# Patient Record
Sex: Male | Born: 1955 | Race: White | Hispanic: No | Marital: Married | Smoking: Current every day smoker
Health system: Southern US, Community
[De-identification: ages and names within clinical notes are randomized; demographics above are authoritative.]

## PROBLEM LIST (undated history)

## (undated) DIAGNOSIS — C801 Malignant (primary) neoplasm, unspecified: Secondary | ICD-10-CM

## (undated) DIAGNOSIS — D869 Sarcoidosis, unspecified: Secondary | ICD-10-CM

## (undated) HISTORY — PX: APPENDECTOMY: SHX54

---

## 2018-01-31 ENCOUNTER — Ambulatory Visit: Payer: Self-pay | Admitting: Family Medicine

## 2018-02-13 ENCOUNTER — Ambulatory Visit: Payer: Self-pay | Admitting: Nurse Practitioner

## 2018-10-07 ENCOUNTER — Emergency Department
Admission: EM | Admit: 2018-10-07 | Discharge: 2018-10-07 | Disposition: A | Discharge status: Home / Self Care | Payer: Commercial Managed Care - PPO | Attending: Student in an Organized Health Care Education/Training Program | Admitting: Student in an Organized Health Care Education/Training Program

## 2018-10-07 ENCOUNTER — Emergency Department: Payer: Commercial Managed Care - PPO

## 2018-10-07 ENCOUNTER — Other Ambulatory Visit: Payer: Self-pay

## 2018-10-07 DIAGNOSIS — R634 Abnormal weight loss: Secondary | ICD-10-CM | POA: Insufficient documentation

## 2018-10-07 DIAGNOSIS — F17210 Nicotine dependence, cigarettes, uncomplicated: Secondary | ICD-10-CM | POA: Diagnosis not present

## 2018-10-07 DIAGNOSIS — M7918 Myalgia, other site: Secondary | ICD-10-CM | POA: Diagnosis not present

## 2018-10-07 DIAGNOSIS — E871 Hypo-osmolality and hyponatremia: Secondary | ICD-10-CM | POA: Diagnosis not present

## 2018-10-07 DIAGNOSIS — F1020 Alcohol dependence, uncomplicated: Secondary | ICD-10-CM | POA: Insufficient documentation

## 2018-10-07 DIAGNOSIS — Z20828 Contact with and (suspected) exposure to other viral communicable diseases: Secondary | ICD-10-CM | POA: Diagnosis not present

## 2018-10-07 HISTORY — DX: Sarcoidosis, unspecified: D86.9

## 2018-10-07 LAB — TROPONIN I (HIGH SENSITIVITY): Troponin I (High Sensitivity): 3 ng/L (ref <18)

## 2018-10-07 LAB — COMPREHENSIVE METABOLIC PANEL
ALT: 29 U/L (ref 0–44)
AST: 42 U/L — ABNORMAL HIGH (ref 15–41)
Albumin: 4.2 g/dL (ref 3.5–5.0)
Alkaline Phosphatase: 114 U/L (ref 38–126)
Anion gap: 12 (ref 5–15)
BUN: 9 mg/dL (ref 8–23)
CO2: 25 mmol/L (ref 22–32)
Calcium: 9.4 mg/dL (ref 8.9–10.3)
Chloride: 93 mmol/L — ABNORMAL LOW (ref 98–111)
Creatinine, Ser: 0.59 mg/dL — ABNORMAL LOW (ref 0.61–1.24)
GFR calc Af Amer: >60 mL/min (ref >60)
GFR calc non Af Amer: >60 mL/min (ref >60)
Glucose, Bld: 106 mg/dL — ABNORMAL HIGH (ref 70–99)
Potassium: 4.9 mmol/L (ref 3.5–5.1)
Sodium: 130 mmol/L — ABNORMAL LOW (ref 135–145)
Total Bilirubin: 1.3 mg/dL — ABNORMAL HIGH (ref 0.3–1.2)
Total Protein: 8 g/dL (ref 6.5–8.1)

## 2018-10-07 LAB — URINALYSIS, COMPLETE (UACMP) WITH MICROSCOPIC
Bacteria, UA: NONE SEEN
Bilirubin Urine: NEGATIVE
Glucose, UA: NEGATIVE mg/dL
Hgb urine dipstick: NEGATIVE
Ketones, ur: 20 mg/dL — AB
Leukocytes,Ua: NEGATIVE
Nitrite: NEGATIVE
Protein, ur: NEGATIVE mg/dL
Specific Gravity, Urine: 1.009 (ref 1.005–1.030)
Squamous Epithelial / HPF: NONE SEEN (ref 0–5)
pH: 6 (ref 5.0–8.0)

## 2018-10-07 LAB — CBC
HCT: 48.1 % (ref 39.0–52.0)
Hemoglobin: 16.4 g/dL (ref 13.0–17.0)
MCH: 33.3 pg (ref 26.0–34.0)
MCHC: 34.1 g/dL (ref 30.0–36.0)
MCV: 97.8 fL (ref 80.0–100.0)
Platelets: 375 10*3/uL (ref 150–400)
RBC: 4.92 MIL/uL (ref 4.22–5.81)
RDW: 12.4 % (ref 11.5–15.5)
WBC: 10.2 10*3/uL (ref 4.0–10.5)
nRBC: 0 % (ref 0.0–0.2)

## 2018-10-07 LAB — LIPASE, BLOOD: Lipase: 36 U/L (ref 11–51)

## 2018-10-07 LAB — SARS CORONAVIRUS 2 (TAT 6-24 HRS): SARS Coronavirus 2: NEGATIVE

## 2018-10-07 MED ORDER — SODIUM CHLORIDE 0.9% FLUSH: 3.0000 mL | Freq: Once | INTRAVENOUS | Status: DC

## 2018-10-07 NOTE — ED Notes (Signed)
MD at bedside. 

## 2018-10-07 NOTE — ED Provider Notes (Signed)
[facility] Emergency Department Provider Note    First MD Initiated Contact with Patient 10/07/18 1200     (approximate)  I have reviewed the triage vital signs and the nursing notes.   HISTORY  Chief Complaint Weight Loss    HPI Darin Lowery is a 63 y.o. male presents the ER for evaluation of over 1 year bilateral shoulder ache tingling in his hands, daily loose brown stools.  Denies any active chest pain pressure right now.  Does smoke 1 pack/day.  Has not seen a primary care physician about any of these complaints.  Denies any nausea or vomiting.  No measured fevers.  Came to the ER this morning because he was having diffuse myalgias as well as cold sweats last night.  States he also drinks 6-7 beers nightly and has been doing this for the past several years.    Past Medical History:  Diagnosis Date  . Sarcoidosis    No family history on file. Past Surgical History:  Procedure Laterality Date  . APPENDECTOMY    . SPLENECTOMY     There are no active problems to display for this patient.     Prior to Admission medications   Not on File    Allergies Patient has no known allergies.    Social History Social History   Tobacco Use  . Smoking status: Current Every Day Smoker    Types: Cigarettes  . Smokeless tobacco: Never Used  Substance Use Topics  . Alcohol use: Yes    Alcohol/week: 6.0 standard drinks    Types: 6 Cans of beer per week  . Drug use: Not Currently    Review of Systems Patient denies headaches, rhinorrhea, blurry vision, numbness, shortness of breath, chest pain, edema, cough, abdominal pain, nausea, vomiting, diarrhea, dysuria, fevers, rashes or hallucinations unless otherwise stated above in HPI. ____________________________________________   PHYSICAL EXAM:  VITAL SIGNS: Vitals:   10/07/18 0945 10/07/18 1214  BP: (!) 136/92 128/85  Pulse: 88 78  Resp: 17 16  Temp: 98.1 F (36.7 C)   SpO2: 97% 99%     Constitutional: Alert and oriented.  Eyes: Conjunctivae are normal.  Head: Atraumatic. Nose: No congestion/rhinnorhea. Mouth/Throat: Mucous membranes are moist.   Neck: No stridor. Painless ROM.  Cardiovascular: Normal rate, regular rhythm. Grossly normal heart sounds.  Good peripheral circulation. Respiratory: Normal respiratory effort.  No retractions. Lungs CTAB. Gastrointestinal: Soft and nontender. No distention. No abdominal bruits. No CVA tenderness. Genitourinary:  Musculoskeletal: No lower extremity tenderness nor edema.  No joint effusions. Neurologic:  Normal speech and language. No gross focal neurologic deficits are appreciated. No facial droop Skin:  Skin is warm, dry and intact. No rash noted. Psychiatric: Mood and affect are normal. Speech and behavior are normal.  ____________________________________________   LABS (all labs ordered are listed, but only abnormal results are displayed)  Results for orders placed or performed during the hospital encounter of 10/07/18 (from the past 24 hour(s))  Troponin I (High Sensitivity)     Status: None   Collection Time: 10/07/18  8:55 AM  Result Value Ref Range   Troponin I (High Sensitivity) 3 <18 ng/L  Lipase, blood     Status: None   Collection Time: 10/07/18  9:55 AM  Result Value Ref Range   Lipase 36 11 - 51 U/L  Comprehensive metabolic panel     Status: Abnormal   Collection Time: 10/07/18  9:55 AM  Result Value Ref Range   Sodium 130 (  L) 135 - 145 mmol/L   Potassium 4.9 3.5 - 5.1 mmol/L   Chloride 93 (L) 98 - 111 mmol/L   CO2 25 22 - 32 mmol/L   Glucose, Bld 106 (H) 70 - 99 mg/dL   BUN 9 8 - 23 mg/dL   Creatinine, Ser 0.59 (L) 0.61 - 1.24 mg/dL   Calcium 9.4 8.9 - 10.3 mg/dL   Total Protein 8.0 6.5 - 8.1 g/dL   Albumin 4.2 3.5 - 5.0 g/dL   AST 42 (H) 15 - 41 U/L   ALT 29 0 - 44 U/L   Alkaline Phosphatase 114 38 - 126 U/L   Total Bilirubin 1.3 (H) 0.3 - 1.2 mg/dL   GFR calc non Af Amer >60 >60 mL/min   GFR  calc Af Amer >60 >60 mL/min   Anion gap 12 5 - 15  CBC     Status: None   Collection Time: 10/07/18  9:55 AM  Result Value Ref Range   WBC 10.2 4.0 - 10.5 K/uL   RBC 4.92 4.22 - 5.81 MIL/uL   Hemoglobin 16.4 13.0 - 17.0 g/dL   HCT 48.1 39.0 - 52.0 %   MCV 97.8 80.0 - 100.0 fL   MCH 33.3 26.0 - 34.0 pg   MCHC 34.1 30.0 - 36.0 g/dL   RDW 12.4 11.5 - 15.5 %   Platelets 375 150 - 400 K/uL   nRBC 0.0 0.0 - 0.2 %  Urinalysis, Complete w Microscopic     Status: Abnormal   Collection Time: 10/07/18  9:55 AM  Result Value Ref Range   Color, Urine YELLOW (A) YELLOW   APPearance CLEAR (A) CLEAR   Specific Gravity, Urine 1.009 1.005 - 1.030   pH 6.0 5.0 - 8.0   Glucose, UA NEGATIVE NEGATIVE mg/dL   Hgb urine dipstick NEGATIVE NEGATIVE   Bilirubin Urine NEGATIVE NEGATIVE   Ketones, ur 20 (A) NEGATIVE mg/dL   Protein, ur NEGATIVE NEGATIVE mg/dL   Nitrite NEGATIVE NEGATIVE   Leukocytes,Ua NEGATIVE NEGATIVE   RBC / HPF 0-5 0 - 5 RBC/hpf   WBC, UA 0-5 0 - 5 WBC/hpf   Bacteria, UA NONE SEEN NONE SEEN   Squamous Epithelial / LPF NONE SEEN 0 - 5   ____________________________________________  EKG My review and personal interpretation at Time: 12:26   Indication: weightloss  Rate: 75  Rhythm: sinus Axis: normal Other: normal intervals, no stemi ____________________________________________  RADIOLOGY  I personally reviewed all radiographic images ordered to evaluate for the above acute complaints and reviewed radiology reports and findings.  These findings were personally discussed with the patient.  Please see medical record for radiology report.  ____________________________________________   PROCEDURES  Procedure(s) performed:  Procedures    Critical Care performed: no ____________________________________________   INITIAL IMPRESSION / ASSESSMENT AND PLAN / ED COURSE  Pertinent labs & imaging results that were available during my care of the patient were reviewed by me and  considered in my medical decision making (see chart for details).   DDX: Electrolyte [place], dehydration, mass, malnutrition, alcohol dependence, alcoholism  Darin Lowery is a 63 y.o. who presents to the ED with symptoms as described above.  Does not have any acute symptoms at this time.  Is currently pain-free.  Seems to be more chronic issues play here.  I do suspect his alcohol dependence is having a deleterious effect on his health.  Does have borderline low sodium but not critically low hyponatremia.  Tolerating oral hydration.  Does not appear  clinically dehydrated.  No acidosis.  CBC is normal.    Clinical Course as of Oct 07 1419  Mon Oct 07, 2018  1420 Patient's blood work is reassuring.  We discussed his low sodium and need for close outpatient follow-up including increasing his daily sodium intake, decreasing beer and alcohol consumption and signs and symptoms for which she should seek more urgent medical attention.  His physical exam repeat exam is reassuring.  Do believe he stable and appropriate for outpatient follow-up.  He does not want to speak with substance abuse counselor at this time.  I provided outpatient resources.  He stable and appropriate for outpatient follow-up.   [PR]    Clinical Course User Index [PR] Merlyn Lot, MD    The patient was evaluated in Emergency Department today for the symptoms described in the history of present illness. He/she was evaluated in the context of the global COVID-19 pandemic, which necessitated consideration that the patient might be at risk for infection with the SARS-CoV-2 virus that causes COVID-19. Institutional protocols and algorithms that pertain to the evaluation of patients at risk for COVID-19 are in a state of rapid change based on information released by regulatory bodies including the CDC and federal and state organizations. These policies and algorithms were followed during the patient's care in the ED.  As part of my  medical decision making, I reviewed the following data within the [facility] notes reviewed and incorporated, Labs reviewed, notes from prior ED visits and [place] Controlled Substance Database   ____________________________________________   FINAL CLINICAL IMPRESSION(S) / ED DIAGNOSES  Final diagnoses:  Weight loss  Low sodium levels  Uncomplicated alcohol dependence ([place])      NEW MEDICATIONS STARTED DURING THIS VISIT:  New Prescriptions   No medications on file     Note:  This document was prepared using Dragon voice recognition software and may include unintentional dictation errors.    Merlyn Lot, MD 10/07/18 1421

## 2018-10-07 NOTE — ED Notes (Signed)
Pt gathered all personal belongings from room and removed them prior to ED departure  

## 2018-10-07 NOTE — ED Triage Notes (Signed)
Pt c/o BL shoulder pain, BL rib pain, 27lb wt loss with constant diarrhea. States he drink 6-7 beers every evening.

## 2018-12-07 ENCOUNTER — Other Ambulatory Visit: Payer: Self-pay

## 2018-12-07 DIAGNOSIS — Z20822 Contact with and (suspected) exposure to covid-19: Secondary | ICD-10-CM

## 2018-12-10 LAB — NOVEL CORONAVIRUS, NAA: SARS-CoV-2, NAA: NOT DETECTED

## 2018-12-29 ENCOUNTER — Encounter: Payer: Self-pay | Admitting: Emergency Medicine

## 2018-12-29 ENCOUNTER — Emergency Department
Admission: EM | Admit: 2018-12-29 | Discharge: 2018-12-29 | Disposition: A | Discharge status: Home / Self Care | Payer: Commercial Managed Care - PPO | Attending: Emergency Medicine | Admitting: Emergency Medicine

## 2018-12-29 ENCOUNTER — Emergency Department: Payer: Commercial Managed Care - PPO

## 2018-12-29 ENCOUNTER — Other Ambulatory Visit: Payer: Self-pay

## 2018-12-29 DIAGNOSIS — R160 Hepatomegaly, not elsewhere classified: Secondary | ICD-10-CM

## 2018-12-29 DIAGNOSIS — F17210 Nicotine dependence, cigarettes, uncomplicated: Secondary | ICD-10-CM | POA: Insufficient documentation

## 2018-12-29 DIAGNOSIS — R5383 Other fatigue: Secondary | ICD-10-CM | POA: Diagnosis present

## 2018-12-29 DIAGNOSIS — R634 Abnormal weight loss: Secondary | ICD-10-CM | POA: Diagnosis not present

## 2018-12-29 DIAGNOSIS — R0789 Other chest pain: Secondary | ICD-10-CM | POA: Diagnosis not present

## 2018-12-29 DIAGNOSIS — R531 Weakness: Secondary | ICD-10-CM | POA: Insufficient documentation

## 2018-12-29 LAB — BASIC METABOLIC PANEL
Anion gap: 10 (ref 5–15)
BUN: 11 mg/dL (ref 8–23)
CO2: 25 mmol/L (ref 22–32)
Calcium: 9.1 mg/dL (ref 8.9–10.3)
Chloride: 98 mmol/L (ref 98–111)
Creatinine, Ser: 0.68 mg/dL (ref 0.61–1.24)
GFR calc Af Amer: >60 mL/min (ref >60)
GFR calc non Af Amer: >60 mL/min (ref >60)
Glucose, Bld: 136 mg/dL — ABNORMAL HIGH (ref 70–99)
Potassium: 4 mmol/L (ref 3.5–5.1)
Sodium: 133 mmol/L — ABNORMAL LOW (ref 135–145)

## 2018-12-29 LAB — CBC
HCT: 44.4 % (ref 39.0–52.0)
Hemoglobin: 14.6 g/dL (ref 13.0–17.0)
MCH: 31 pg (ref 26.0–34.0)
MCHC: 32.9 g/dL (ref 30.0–36.0)
MCV: 94.3 fL (ref 80.0–100.0)
Platelets: 498 10*3/uL — ABNORMAL HIGH (ref 150–400)
RBC: 4.71 MIL/uL (ref 4.22–5.81)
RDW: 12.9 % (ref 11.5–15.5)
WBC: 11.9 10*3/uL — ABNORMAL HIGH (ref 4.0–10.5)
nRBC: 0 % (ref 0.0–0.2)

## 2018-12-29 LAB — TSH: TSH: 2.677 u[IU]/mL (ref 0.350–4.500)

## 2018-12-29 LAB — URINALYSIS, COMPLETE (UACMP) WITH MICROSCOPIC
Bacteria, UA: NONE SEEN
Bilirubin Urine: NEGATIVE
Glucose, UA: NEGATIVE mg/dL
Hgb urine dipstick: NEGATIVE
Ketones, ur: NEGATIVE mg/dL
Leukocytes,Ua: NEGATIVE
Nitrite: NEGATIVE
Protein, ur: NEGATIVE mg/dL
Specific Gravity, Urine: 1.009 (ref 1.005–1.030)
Squamous Epithelial / HPF: NONE SEEN (ref 0–5)
pH: 6 (ref 5.0–8.0)

## 2018-12-29 LAB — HEPATIC FUNCTION PANEL
ALT: 44 U/L (ref 0–44)
AST: 73 U/L — ABNORMAL HIGH (ref 15–41)
Albumin: 3.2 g/dL — ABNORMAL LOW (ref 3.5–5.0)
Alkaline Phosphatase: 374 U/L — ABNORMAL HIGH (ref 38–126)
Bilirubin, Direct: 0.3 mg/dL — ABNORMAL HIGH (ref 0.0–0.2)
Indirect Bilirubin: 0.7 mg/dL (ref 0.3–0.9)
Total Bilirubin: 1 mg/dL (ref 0.3–1.2)
Total Protein: 7.2 g/dL (ref 6.5–8.1)

## 2018-12-29 LAB — LIPASE, BLOOD: Lipase: 29 U/L (ref 11–51)

## 2018-12-29 MED ORDER — IOHEXOL 300 MG/ML  SOLN
100.0000 mL | Freq: Once | INTRAMUSCULAR | Status: AC | PRN
  Administered 2018-12-29: 100 mL via INTRAVENOUS

## 2018-12-29 NOTE — ED Notes (Signed)
Pt has returned from CT.  

## 2018-12-29 NOTE — ED Notes (Signed)
Patient transported to CT 

## 2018-12-29 NOTE — ED Notes (Signed)
Patient states that he has had about 30 pound weight loss in the last 6 months. Pt states that he eats, but still loses weight. Pt denies N/V/D, or abdominal pain. Pt reports occasional "soreness" in his chest.

## 2018-12-29 NOTE — ED Triage Notes (Signed)
Patient presents to the ED with weakness, fatigue and weight loss for several months.  Patient states going up the stairs at his house feels impossible at times.  Patient reports losing 30lb without trying to over the past couple of months.  Patient reports lower left chest pain when he does minor activities.

## 2018-12-29 NOTE — Discharge Instructions (Addendum)
Please call Dr. Kem Parkinson office tomorrow morning to start helping arrange follow up. Please seek medical attention for any high fevers, chest pain, shortness of breath, change in behavior, persistent vomiting, bloody stool or any other new or concerning symptoms.

## 2018-12-29 NOTE — ED Provider Notes (Signed)
[facility] Emergency Department Provider Note  ____________________________________________   I have reviewed the triage vital signs and the nursing notes.   HISTORY  Chief Complaint Fatigue and Weight Loss   History limited by: Not Limited   HPI Darin Lowery is a 63 y.o. male who presents to the emergency department today because of concern for fatigue for weeks. He is also concerned that he has continued weight loss. The patient states that normally he can get up early in the morning and go to work and still have energy afterwards but now feels that he has a hard time getting out of the bed and then is exhausted when he returns home. He has cut down on his drinking, only drinking 3 beers a day. Denies any fevers. Does state he has been having pain in his left lower chest/left upper abdomen. Did try to make a follow up appointment with a primary care physician but appointment is still not for a couple of months. Denies any nausea or vomiting. Denies any current diarrhea.    Records reviewed. Per medical record review patient has a history of ER visit roughly 2 months ago also complaining of weight loss.   Past Medical History:  Diagnosis Date  . Sarcoidosis     There are no problems to display for this patient.   Past Surgical History:  Procedure Laterality Date  . APPENDECTOMY    . SPLENECTOMY      Prior to Admission medications   Not on File    Allergies Patient has no known allergies.  No family history on file.  Social History Social History   Tobacco Use  . Smoking status: Current Every Day Smoker    Types: Cigarettes  . Smokeless tobacco: Never Used  Substance Use Topics  . Alcohol use: Yes    Alcohol/week: 6.0 standard drinks    Types: 6 Cans of beer per week  . Drug use: Not Currently    Review of Systems Constitutional: No fever/chills Eyes: No visual changes. ENT: No sore throat. Cardiovascular: Positive for left  lower chest pain. Respiratory: Denies shortness of breath. Gastrointestinal: Positive for left upper abdominal pain. Genitourinary: Negative for dysuria. Musculoskeletal: Negative for back pain. Skin: Negative for rash. Neurological: Negative for headaches, focal weakness or numbness.  ____________________________________________   PHYSICAL EXAM:  VITAL SIGNS: ED Triage Vitals [12/29/18 1228]  Enc Vitals Group     BP 138/85     Pulse Rate 89     Resp 16     Temp 98.6 F (37 C)     Temp Source Oral     SpO2 100 %     Weight 137 lb (62.1 kg)     Height 5' 10" (1.778 m)     Head Circumference      Peak Flow      Pain Score 2   Constitutional: Alert and oriented.  Eyes: Conjunctivae are normal.  ENT      Head: Normocephalic and atraumatic.      Nose: No congestion/rhinnorhea.      Mouth/Throat: Mucous membranes are moist.      Neck: No stridor. Hematological/Lymphatic/Immunilogical: No cervical lymphadenopathy. Cardiovascular: Normal rate, regular rhythm.  No murmurs, rubs, or gallops.  Respiratory: Normal respiratory effort without tachypnea nor retractions. Breath sounds are clear and equal bilaterally. No wheezes/rales/rhonchi. Gastrointestinal: Soft and non tender. No rebound. No guarding.  Genitourinary: Deferred Musculoskeletal: Normal range of motion in all extremities. No lower extremity edema. Neurologic:  Normal  speech and language. No gross focal neurologic deficits are appreciated.  Skin:  Skin is warm, dry and intact. No rash noted. Psychiatric: Mood and affect are normal. Speech and behavior are normal. Patient exhibits appropriate insight and judgment.  ____________________________________________    LABS (pertinent positives/negatives)  UA clear, unremarkable BMP na 133, glu 136 otherwise wnl CBC wbc 11.9, hgb 14.9, plt 498 Hepatic function panel alb 3.2, ast 73, alk phos 374, t bili 1.0 TSH 2.677 Lipase  29 ____________________________________________   EKG  I, Graydon Goodman, attending physician, personally viewed and interpreted this EKG  EKG Time: 1234 Rate: 89 Rhythm: normal sinus rhythm Axis: right axis deviation Intervals: qtc 435 QRS: narrow ST changes: no st elevation Impression: abnormal ekg  ____________________________________________    RADIOLOGY  CXR Questionable nodules seen in left lower lung  CT chest/abd/pelvis 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 Concern for hepatocellular carcinoma.  ____________________________________________   PROCEDURES  Procedures  ____________________________________________   INITIAL IMPRESSION / ASSESSMENT AND PLAN / ED COURSE  Pertinent labs & imaging results that were available during my care of the patient were reviewed by me and considered in my medical decision making (see chart for details).   Patient presented to the emergency department today because of concerns for increasing fatigue and continued weight loss.  On exam patient without any focal neuro findings.  Chest x-ray was concerning for possible nodules.  CT scan of his chest and abdomen pelvis was performed.  This did show a large mass in the liver with satellite lesions and concern for metastatic disease in the lungs.  Is concerning for hepatocellular carcinoma.  Patient would certainly be at risk for this given heavy alcohol use.  I did discuss with the patient these findings.  Discussed with patient importance of close oncology follow-up.  ___________________________________________   FINAL CLINICAL IMPRESSION(S) / ED DIAGNOSES  Final diagnoses:  Weakness  Weight loss  Liver mass     Note: This dictation was prepared with Dragon dictation. Any transcriptional errors that result from this process are unintentional     Goodman, Graydon, MD 12/29/18 1909  

## 2018-12-30 NOTE — Progress Notes (Signed)
[facility]  [address] [place], [place] [address] Phone: [phone]  Fax: [phone]   Clinic Day:  12/31/2018  Referring physician: Nance Pear, MD  Chief Complaint: Darin Lowery is a 63 y.o. male with sarcoidosis and possible hepatocellular carcinoma who is referred in consultation by Dr. Nance Lowery for assessment and management.   HPI:  The patient was seen in the [facility] ER on 12/29/2018 for fatigue and unintentional weight loss. He described fatigue for weeks.  He denied any fevers. He noted left upper abdominal pain (2/10) and left lower chest pain (2/10). He denied any nausea, emesis, or diarrhea. He previously drank 7-8 beers/day, but has cut back to 3 beers/day.   Labs included a hematocrit of 44.4, hemoglobin 14.6, MCV 94.3, platelets 498,000, and WBC 11,900.  TSH was 2.677 (0.35 - 4.50).  Creatinine was 0.68.  Albumen was 3.2.  LFTs included an AST 73, ALT 44, alkaline phosphatase 374, and bilirubin 0.7 (direct 0.3).  Lipase was 29.  Urinalysis was negative.  CXR on 12/29/2018 showed bronchitic changes with questionable LEFT lung nodular foci and potential RIGHT nipple shadow.   Chest, abdomen, and pelvis CT on 12/29/2018 revealed a 12 x 12 x 10 cm heterogeneously enhancing mass involving the RIGHT lobe of the liver, likely hepatocellular carcinoma.  There were satellite masses in the POSTERIOR segment RIGHT lobe (1.5 x 1.3 x 1.8 cm) and in the LATERAL segment LEFT lobe of the liver (1.9 x 1.7 x 1.5 cm). There was metastatic lymphadenopathy in the porta hepatis and in the gastrohepatic ligament. There was numerous bilateral pulmonary metastases (largest 12 x 8 mm in the RLL) and metastatic mediastinal lymphadenopathy. There was a 1.4 x 2.3 cm partially necrotic subcarinal node and a 1.7 x 2.3 cm necrotic paracardiac node INFERIOR to the heart.  There was likely hepatic cirrhosis. There was no ascites.  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 There was moderate prostate gland enlargement, particularly the median lobe. There was a 3.1 cm proximal descending thoracic aortic aneurysm.   Symptomatically, he feels fatigued.  He has rib pain. He has whole body pain after any physical activity. He had a fever (102 degrees) about 1.5 months ago.  COVID-19 test was negative. Fever was reduced with Tylenol.  He denies any bloody stool or melena. His wife notes that he has frequency (x3-4) at night.   He notes unintentional weight loss over the past year. His normal weight is 165 pounds; his lowest weight was 130 pounds. He weighs 142 pounds today. He is eating well with no change in his diet. He has had off and on diarrhea for 1 year. He denies any constipation, flushing, nausea, or vomiting.    His last GI evaluation was years ago. His last colonoscopy was normal in 2014 at [facility].  He notes  scarring of the stomach lining after being on prednisone and a cold medication that contained aspirin.  He had a blood transfusion in the 1980s.   He has a history of sarcoidosis diagnosed on 1980. He notes his spleen was enlarged and full of granulomas.  He later underwent a splenectomy. He denies any adenopathy in his chest.  He had a kidney biopsy at [facility]. He notes 2 or 3 liver biopsies with report of granuloma.  He was seen at [facility] in [place], [place].  He currently has no PCP.    Past Medical History:  Diagnosis Date  . Sarcoidosis  Past Surgical History:  Procedure Laterality Date  . APPENDECTOMY    . SPLENECTOMY      Family History  Problem Relation Age of Onset  . Liver disease Father   His father had hemophilia.  He died from cirrhosis secondary to hepatitis B received from a blood transfusion. There are no other known blood disorders or cancers that run in his family.    Social History:  reports that he has been smoking cigarettes. He has never used smokeless  tobacco. He reports current alcohol use of about 6.0 standard drinks of alcohol per week. He reports previous drug use. 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 He has been smoking for 40 years. Patient smokes 1.5 packs a day. Patient used to drink 7-8 beers a day for > 3 years. The patient is accompanied by his wife, Darin Lowery, via Face time today.  Allergies: No Known Allergies  Current Medications: No current outpatient medications on file.   No current facility-administered medications for this visit.    Review of Systems  Constitutional: Positive for malaise/fatigue and weight loss (23 pounds; unintentional ). Negative for chills, diaphoresis and fever (1 month ago).  HENT: Negative.  Negative for congestion, ear pain, hearing loss, nosebleeds, sinus pain and sore throat.   Eyes: Negative.  Negative for blurred vision, double vision and photophobia.  Respiratory: Positive for cough and shortness of breath. Negative for hemoptysis and sputum production.   Cardiovascular: Negative.  Negative for chest pain, palpitations and leg swelling.  Gastrointestinal: Positive for diarrhea (on and off; episodes last x 1 week). Negative for abdominal pain, blood in stool, constipation, melena, nausea and vomiting.       Colonoscopy in 2014.  Eating well.  No change in diet.  Genitourinary: Positive for frequency (3-4 x nightly). Negative for dysuria, hematuria and urgency.  Musculoskeletal: Negative for back pain, joint pain (bilateral shoulders secondary to physical activity), myalgias (bilateral arms secondary to physical activity) and neck pain.       Whole body pain after physical activity. Rib pain exacerbated by coughing.  Skin: Negative.  Negative for itching and rash.  Neurological: Positive for sensory change (numbness). Negative for dizziness, tingling, speech change, focal weakness, weakness and headaches.  Endo/Heme/Allergies: Does not bruise/bleed easily.        Sarcoidosis.  Psychiatric/Behavioral: Negative.  Negative for depression and memory loss. The patient is not nervous/anxious and does not have insomnia.   All other systems reviewed and are negative.  Performance status (ECOG): 1  Vitals Blood pressure 130/82, pulse 83, temperature (!) 96.7 F (35.9 C), temperature source Tympanic, weight 142 lb 3.2 oz (64.5 kg), SpO2 97 %.   Physical Exam  Constitutional: He is oriented to person, place, and time. He appears well-developed and well-nourished. No distress.  HENT:  Head: Normocephalic and atraumatic.  Mouth/Throat: Oropharynx is clear and moist. No oropharyngeal exudate.  Black cap.  Short graying hair.   Mask.  Eyes: Pupils are equal, round, and reactive to light. Conjunctivae and EOM are normal. No scleral icterus.  Glasses.  Hazel/brown eyes.  Cardiovascular: Normal rate and regular rhythm.  No murmur heard. Pulmonary/Chest: Effort normal and breath sounds normal. No respiratory distress. He has no wheezes. He has no rales. He exhibits no tenderness.  Abdominal: Soft. Bowel sounds are normal. He exhibits no distension and no mass. There is hepatomegaly (palpable 3 FB below right costal margin). There is no abdominal tenderness. There is no rebound  and no guarding.  Well healed incisions s/p liver biopsy and splenectomy.  Musculoskeletal:        General: No tenderness or edema. Normal range of motion.     Cervical back: Normal range of motion and neck supple.  Lymphadenopathy:       Head (right side): No preauricular, no posterior auricular and no occipital adenopathy present.       Head (left side): No preauricular, no posterior auricular and no occipital adenopathy present.    He has no cervical adenopathy.    He has no axillary adenopathy.       Right: No inguinal and no supraclavicular adenopathy present.       Left: No inguinal and no supraclavicular adenopathy present.  Neurological: He is alert and oriented to person, place,  and time.  Skin: Skin is warm and dry. No rash noted. He is not diaphoretic. No erythema. No pallor.  Psychiatric: He has a normal mood and affect. His behavior is normal. Judgment and thought content normal.  Nursing note and vitals reviewed.   Admission on 12/29/2018, Discharged on 12/29/2018  Component Date Value Ref Range Status  . Sodium 12/29/2018 133* 135 - 145 mmol/L Final  . Potassium 12/29/2018 4.0  3.5 - 5.1 mmol/L Final  . Chloride 12/29/2018 98  98 - 111 mmol/L Final  . CO2 12/29/2018 25  22 - 32 mmol/L Final  . Glucose, Bld 12/29/2018 136* 70 - 99 mg/dL Final  . BUN 12/29/2018 11  8 - 23 mg/dL Final  . Creatinine, Ser 12/29/2018 0.68  0.61 - 1.24 mg/dL Final  . Calcium 12/29/2018 9.1  8.9 - 10.3 mg/dL Final  . GFR calc non Af Amer 12/29/2018 >60  >60 mL/min Final  . GFR calc Af Amer 12/29/2018 >60  >60 mL/min Final  . Anion gap 12/29/2018 10  5 - 15 Final   Performed at [facility], [address], [place], [place] [address]  . WBC 12/29/2018 11.9* 4.0 - 10.5 K/uL Final  . RBC 12/29/2018 4.71  4.22 - 5.81 MIL/uL Final  . Hemoglobin 12/29/2018 14.6  13.0 - 17.0 g/dL Final  . HCT 12/29/2018 44.4  39.0 - 52.0 % Final  . MCV 12/29/2018 94.3  80.0 - 100.0 fL Final  . MCH 12/29/2018 31.0  26.0 - 34.0 pg Final  . MCHC 12/29/2018 32.9  30.0 - 36.0 g/dL Final  . RDW 12/29/2018 12.9  11.5 - 15.5 % Final  . Platelets 12/29/2018 498* 150 - 400 K/uL Final  . nRBC 12/29/2018 0.0  0.0 - 0.2 % Final   Performed at [facility], [address], [place],  [address]  . Color, Urine 12/29/2018 YELLOW* YELLOW Final  . APPearance 12/29/2018 CLEAR* CLEAR Final  . Specific Gravity, Urine 12/29/2018 1.009  1.005 - 1.030 Final  . pH 12/29/2018 6.0  5.0 - 8.0 Final  . Glucose, UA 12/29/2018 NEGATIVE  NEGATIVE mg/dL Final  . Hgb urine dipstick 12/29/2018 NEGATIVE  NEGATIVE Final  . Bilirubin Urine 12/29/2018 NEGATIVE  NEGATIVE Final  . Ketones, ur 12/29/2018  NEGATIVE  NEGATIVE mg/dL Final  . Protein, ur 12/29/2018 NEGATIVE  NEGATIVE mg/dL Final  . Nitrite 12/29/2018 NEGATIVE  NEGATIVE Final  . Chalmers Guest 12/29/2018 NEGATIVE  NEGATIVE Final  . RBC / HPF 12/29/2018 0-5  0 - 5 RBC/hpf Final  . WBC, UA 12/29/2018 0-5  0 - 5 WBC/hpf Final  . Bacteria, UA 12/29/2018 NONE SEEN  NONE SEEN Final  . Squamous Epithelial /  LPF 12/29/2018 NONE SEEN  0 - 5 Final   Performed at [facility], [address] [place], [place] [address]  . Total Protein 12/29/2018 7.2  6.5 - 8.1 g/dL Final  . Albumin 12/29/2018 3.2* 3.5 - 5.0 g/dL Final  . AST 12/29/2018 73* 15 - 41 U/L Final  . ALT 12/29/2018 44  0 - 44 U/L Final  . Alkaline Phosphatase 12/29/2018 374* 38 - 126 U/L Final  . Total Bilirubin 12/29/2018 1.0  0.3 - 1.2 mg/dL Final  . Bilirubin, Direct 12/29/2018 0.3* 0.0 - 0.2 mg/dL Final  . Indirect Bilirubin 12/29/2018 0.7  0.3 - 0.9 mg/dL Final   Performed at [facility], [address], [place], [place] [address]  . Lipase 12/29/2018 29  11 - 51 U/L Final   Performed at [facility], [address] [place], [place] [address]  . TSH 12/29/2018 2.677  0.350 - 4.500 uIU/mL Final   Comment: Performed by a 3rd Generation assay with a functional sensitivity of <=0.01 uIU/mL. Performed at [facility], [address], [place], [place] [address]     Assessment:  Darin Lowery is a 63 y.o. male with probable metastatic hepatocellular carcinoma.  Chest, abdomen, and pelvis CT on 12/29/2018 revealed a 12 x 12 x 10 cm heterogeneously enhancing mass involving the RIGHT lobe of the liver, likely indicating hepatocellular carcinoma.  There were satellite masses in the POSTERIOR segment RIGHT lobe (1.5 x 1.3 x 1.8 cm) and in the LATERAL segment LEFT lobe of the liver (1.9 x 1.7 x 1.5 cm). There was metastatic lymphadenopathy in the porta hepatis and in the gastrohepatic ligament. There was numerous bilateral pulmonary metastases  (largest 12 x 8 mm in the RLL) and metastatic mediastinal lymphadenopathy. There was a 1.4 x 2.3 cm partially necrotic subcarinal node and a 1.7 x 2.3 cm necrotic paracardiac node INFERIOR to the heart.  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  There was no ascites.  There was moderate prostate gland enlargement, particularly the median lobe. There was a 3.1 cm proximal descending thoracic aortic aneurysm.   Child-Pugh score is 6 (class A).   He has a history of sarcoid s/p multiple biopsies (including liver) which revealed granulomas (no report available).  He has a history of blood transfusion in the 1980s.  He has a 60 pack year smoking history.  Symptomatically, he is fatigued.  He has lost 23 pounds in the past year.  He has had on and off diarrhea.  Exam reveals hepatomegaly.  Plan: 1.   Labs today:  AFP, CEA, chromogranin, PSA, PT/INR, hepatitis B core antibody total, hepatitis B surface antigen, hepatitis C antibody. 2.   Probable metastatic hepatocellular carcinoma  Patient has underlying cirrhosis with varices and no ascites.  He has no history of hepatitis, but notes a history of transfusion.  He has a history of alcohol use.  Discuss plan for liver biopsy after review of imaging with radiology.  Patient has unresectable disease.  Preliminary discussions regarding treatment options if [place] documented.   Lenvatinib or atezolizumab + Avastin.  3.   Bone pain  Patient notes pain in ribs and upper arms.  Bone scan to r/o metastasis. 4.   Enlarged prostate  Doubt significance.  Check PSA. 5.   Anticipate liver biopsy next week. 6.   Present at tumor board on 01/09/2019. 7.   RTC 3 days after liver biopsy for MD assessment, review of labs and biopsy, and discussion regarding direction  of therapy.  Addendum:  AFP was 38,357.  Discussed with radiology who recommended liver MRI prior to consideration of biopsy.  I discussed the assessment and treatment plan with the  patient.  The patient was provided an opportunity to ask questions and all were answered.  The patient agreed with the plan and demonstrated an understanding of the instructions.  The patient was advised to call back if the symptoms worsen or if the condition fails to improve as anticipated.   Merlin Golden C. Mike Gip, MD, PhD    12/31/2018, 11:42 AM  I, Selena Batten, am acting as scribe for [employer]. Mike Gip, MD, PhD.  I, Rai Severns C. Mike Gip, MD, have reviewed the above documentation for accuracy and completeness, and I agree with the above.

## 2018-12-31 ENCOUNTER — Telehealth: Payer: Self-pay

## 2018-12-31 ENCOUNTER — Other Ambulatory Visit: Payer: Self-pay

## 2018-12-31 ENCOUNTER — Inpatient Hospital Stay: Payer: Commercial Managed Care - PPO

## 2018-12-31 ENCOUNTER — Inpatient Hospital Stay: Payer: Commercial Managed Care - PPO | Attending: Hematology and Oncology | Admitting: Hematology and Oncology

## 2018-12-31 ENCOUNTER — Encounter: Payer: Self-pay | Admitting: Hematology and Oncology

## 2018-12-31 VITALS — BP 130/82 | HR 83 | Temp 96.7°F | Wt 142.2 lb

## 2018-12-31 DIAGNOSIS — N400 Enlarged prostate without lower urinary tract symptoms: Secondary | ICD-10-CM

## 2018-12-31 DIAGNOSIS — R5383 Other fatigue: Secondary | ICD-10-CM | POA: Insufficient documentation

## 2018-12-31 DIAGNOSIS — C7801 Secondary malignant neoplasm of right lung: Secondary | ICD-10-CM | POA: Diagnosis present

## 2018-12-31 DIAGNOSIS — M898X2 Other specified disorders of bone, upper arm: Secondary | ICD-10-CM

## 2018-12-31 DIAGNOSIS — C7802 Secondary malignant neoplasm of left lung: Secondary | ICD-10-CM | POA: Diagnosis present

## 2018-12-31 DIAGNOSIS — Z87891 Personal history of nicotine dependence: Secondary | ICD-10-CM | POA: Insufficient documentation

## 2018-12-31 DIAGNOSIS — Z9081 Acquired absence of spleen: Secondary | ICD-10-CM | POA: Insufficient documentation

## 2018-12-31 DIAGNOSIS — R748 Abnormal levels of other serum enzymes: Secondary | ICD-10-CM

## 2018-12-31 DIAGNOSIS — R197 Diarrhea, unspecified: Secondary | ICD-10-CM

## 2018-12-31 DIAGNOSIS — C801 Malignant (primary) neoplasm, unspecified: Secondary | ICD-10-CM | POA: Diagnosis present

## 2018-12-31 DIAGNOSIS — C772 Secondary and unspecified malignant neoplasm of intra-abdominal lymph nodes: Secondary | ICD-10-CM | POA: Diagnosis not present

## 2018-12-31 DIAGNOSIS — Z7289 Other problems related to lifestyle: Secondary | ICD-10-CM | POA: Diagnosis not present

## 2018-12-31 DIAGNOSIS — F17210 Nicotine dependence, cigarettes, uncomplicated: Secondary | ICD-10-CM | POA: Diagnosis not present

## 2018-12-31 DIAGNOSIS — R918 Other nonspecific abnormal finding of lung field: Secondary | ICD-10-CM

## 2018-12-31 DIAGNOSIS — R634 Abnormal weight loss: Secondary | ICD-10-CM | POA: Diagnosis not present

## 2018-12-31 DIAGNOSIS — K7460 Unspecified cirrhosis of liver: Secondary | ICD-10-CM

## 2018-12-31 DIAGNOSIS — R160 Hepatomegaly, not elsewhere classified: Secondary | ICD-10-CM

## 2018-12-31 DIAGNOSIS — Z7189 Other specified counseling: Secondary | ICD-10-CM | POA: Insufficient documentation

## 2018-12-31 DIAGNOSIS — D869 Sarcoidosis, unspecified: Secondary | ICD-10-CM | POA: Diagnosis not present

## 2018-12-31 DIAGNOSIS — R0781 Pleurodynia: Secondary | ICD-10-CM

## 2018-12-31 LAB — PROTIME-INR
INR: 1 (ref 0.8–1.2)
Prothrombin Time: 12.9 seconds (ref 11.4–15.2)

## 2018-12-31 LAB — PSA: Prostatic Specific Antigen: 1.25 ng/mL (ref 0.00–4.00)

## 2018-12-31 LAB — HEPATITIS C ANTIBODY: HCV Ab: NONREACTIVE

## 2018-12-31 LAB — APTT: aPTT: 31 seconds (ref 24–36)

## 2018-12-31 LAB — HEPATITIS B SURFACE ANTIGEN: Hepatitis B Surface Ag: NONREACTIVE

## 2018-12-31 LAB — HEPATITIS B CORE ANTIBODY, TOTAL: Hep B Core Total Ab: NONREACTIVE

## 2018-12-31 NOTE — Telephone Encounter (Signed)
Faxed bx orders to scheduling dept. For a liver bx CT guided.

## 2019-01-01 LAB — AFP TUMOR MARKER: AFP, Serum, Tumor Marker: 38357 ng/mL — ABNORMAL HIGH (ref 0.0–8.3)

## 2019-01-01 LAB — CEA: CEA: 5.8 ng/mL — ABNORMAL HIGH (ref 0.0–4.7)

## 2019-01-02 ENCOUNTER — Telehealth: Payer: Self-pay | Admitting: Hematology and Oncology

## 2019-01-02 LAB — CHROMOGRANIN A: Chromogranin A (ng/mL): 59.4 ng/mL (ref 0.0–101.8)

## 2019-01-02 NOTE — Telephone Encounter (Signed)
Re:  Liver biopsy vs liver MRI  Discussed with patient's wife conversation with radiology regarding best location for biopsy- liver.  Radiology felt liver MRI may provide the diagnosis.  If liver MRI is not definitive, biopsy would be required.  Patient's wife in agreement.  Order placed.  Patient to be presented at tumor board on 01/09/2019.  Review labs revealing markedly elevated AFP.   Lequita Asal, MD

## 2019-01-08 ENCOUNTER — Ambulatory Visit
Admission: RE | Admit: 2019-01-08 | Discharge: 2019-01-08 | Disposition: A | Discharge status: Home / Self Care | Payer: Commercial Managed Care - PPO | Source: Ambulatory Visit | Attending: Hematology and Oncology | Admitting: Hematology and Oncology

## 2019-01-08 ENCOUNTER — Encounter (HOSPITAL_BASED_OUTPATIENT_CLINIC_OR_DEPARTMENT_OTHER)
Admission: RE | Admit: 2019-01-08 | Discharge: 2019-01-08 | Disposition: A | Discharge status: Home / Self Care | Payer: Commercial Managed Care - PPO | Source: Ambulatory Visit | Attending: Hematology and Oncology | Admitting: Hematology and Oncology

## 2019-01-08 ENCOUNTER — Other Ambulatory Visit: Payer: Self-pay

## 2019-01-08 DIAGNOSIS — M898X2 Other specified disorders of bone, upper arm: Secondary | ICD-10-CM

## 2019-01-08 DIAGNOSIS — R0781 Pleurodynia: Secondary | ICD-10-CM | POA: Insufficient documentation

## 2019-01-08 DIAGNOSIS — R160 Hepatomegaly, not elsewhere classified: Secondary | ICD-10-CM

## 2019-01-08 MED ORDER — TECHNETIUM TC 99M MEDRONATE IV KIT
20.0000 | PACK | Freq: Once | INTRAVENOUS | Status: AC | PRN
  Administered 2019-01-08: 22.2 via INTRAVENOUS

## 2019-01-09 ENCOUNTER — Other Ambulatory Visit: Payer: Commercial Managed Care - PPO

## 2019-01-09 NOTE — Progress Notes (Signed)
Tumor Board Documentation  Darin Lowery was presented by Dr Mike Gip at our Tumor Board on 01/09/2019, which included representatives from medical oncology, radiation oncology, navigation, pathology, radiology, surgical, surgical oncology, internal medicine, pharmacy, genetics, palliative care, research.  Darin Lowery currently presents as a new patient, for discussion with history of the following treatments: active survellience.  Additionally, we reviewed previous medical and familial history, history of present illness, and recent lab results along with all available histopathologic and imaging studies. The tumor board considered available treatment options and made the following recommendations: Additional screening, Biopsy, Immunotherapy    The following procedures/referrals were also placed: No orders of the defined types were placed in this encounter.   Clinical Trial Status: not discussed   Staging used:    National site-specific guidelines   were discussed with respect to the case.  Tumor board is a meeting of clinicians from various specialty areas who evaluate and discuss patients for whom a multidisciplinary approach is being considered. Final determinations in the plan of care are those of the provider(s). The responsibility for follow up of recommendations given during tumor board is that of the provider.   Today's extended care, comprehensive team conference, Darin Lowery was not present for the discussion and was not examined.   Multidisciplinary Tumor Board is a multidisciplinary case peer review process.  Decisions discussed in the Multidisciplinary Tumor Board reflect the opinions of the specialists present at the conference without having examined the patient.  Ultimately, treatment and diagnostic decisions rest with the primary provider(s) and the patient.

## 2019-01-11 ENCOUNTER — Ambulatory Visit
Admission: RE | Admit: 2019-01-11 | Discharge: 2019-01-11 | Disposition: A | Discharge status: Home / Self Care | Payer: Commercial Managed Care - PPO | Source: Ambulatory Visit | Attending: Hematology and Oncology | Admitting: Hematology and Oncology

## 2019-01-11 ENCOUNTER — Other Ambulatory Visit: Payer: Self-pay

## 2019-01-11 DIAGNOSIS — R160 Hepatomegaly, not elsewhere classified: Secondary | ICD-10-CM | POA: Diagnosis present

## 2019-01-11 DIAGNOSIS — K7460 Unspecified cirrhosis of liver: Secondary | ICD-10-CM | POA: Diagnosis present

## 2019-01-11 MED ORDER — GADOBUTROL 1 MMOL/ML IV SOLN
6.0000 mL | Freq: Once | INTRAVENOUS | Status: AC | PRN
  Administered 2019-01-11: 14:00:00 6 mL via INTRAVENOUS

## 2019-01-14 NOTE — Progress Notes (Signed)
[facility]  [address] [place], [place] [address] Phone: [phone]  Fax: [phone]   Clinic Day:  01/15/2019  Referring physician: Sallee Lange, *  Chief Complaint: Darin Lowery is a 64 y.o. male with sarcoidosis and hepatocellular carcinoma who is seen for review of interval imaging and discussion regarding direction of treatment after initial consultation.   HPI: The patient was last seen in the medical oncology clinic on 12/31/2018 for initial consultation.  Chest, abdomen, and pelvics CT revealed a 12 cm mass in the right lobe of the liver with satellite masses in the posterior segment of the right lobe and lateral segment of the left lobe. There was metastatic lymphadenopathy in the porta hepatis, mediastinal adenopathy, and bilateral pulmonary nodules.  Symptomatically,  he was fatigued.  He had lost 23 pounds in the past year.  He had on and off diarrhea. Exam revealed hepatomegaly.   Work up revealed:  AFP 38,357, CEA 5.7, PSA of 1.25, chromogranin A 59.4.  Hepatitis B core and surface antigen and hepatitis C were negative.  PT/INR was 12.9/1.0 and PTT was 31.  Bone scan on 01/08/2019 showed focal activity in the medial left iliac bone. Depending on clinical concern this could be further evaluated with MRI of the pelvis. therwise there was no evidence of malignancy.  Tumor board recommendation on 01/09/2019 were additional screenings, a biopsy and immunotherapy.   Liver MRI on 01/11/2019 showed cirrhosis. There was an avidly enhancing dominant poorly marginated 12.3 cm superior liver mass, compatible with locally advanced hepatocellular carcinoma. There were numerous (> 10) additional avidly enhancing liver metastases scattered throughout the liver as detailed. There was porta hepatis and gastrohepatic ligament lymphadenopathy, suspicious for metastatic disease.  There were numerous subcentimeter pulmonary nodules scattered at both  lung bases, suspicious for pulmonary metastases. There was trace perihepatic ascites.  There was moderate to large esophageal varices. There were scattered regions of mild intrahepatic biliary ductal dilatation throughout the liver due to mass-effect from liver masses. There was a normal caliber common bile duct (2 mm diameter).  During the interim, he has had some nausea and issues sleeping but otherwise unchanged.  Symptomatically, he feels the same. He would like to be treated without a biopsy. He is willing to see a gastroenterologist regarding his esophageal varices. He is leaning more towards IV treatment rather than pills (lenvatinib).  He is not interested in a port-a-cath, but will have the nurse assess his veins.   Past Medical History:  Diagnosis Date  . Sarcoidosis     Past Surgical History:  Procedure Laterality Date  . APPENDECTOMY    . SPLENECTOMY      Family History  Problem Relation Age of Onset  . Liver disease Father     Social History:  reports that he has been smoking cigarettes. He has never used smokeless tobacco. He reports current alcohol use of about 6.0 standard drinks of alcohol per week. He reports previous drug use. 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 He has been smoking for 40 years. Patient smokes 1.5 packs a day. Patient used to drink 7-8 beers a day for > 3 years. He owns a [employer]. His wife's name is [name]. The patient is accompanied by wife via video today.  Allergies: No Known Allergies  Current Medications: Current Outpatient Medications  Medication Sig Dispense Refill  . ondansetron (ZOFRAN) 8 MG tablet Take 1 tablet (8 mg total) by  mouth every 8 (eight) hours as needed for nausea or vomiting. 20 tablet 0   No current facility-administered medications for this visit.    Review of Systems  Constitutional: Positive for malaise/fatigue and weight loss (24 pounds; 1 pound since last visit). Negative for  chills, diaphoresis and fever.  HENT: Negative.  Negative for congestion, ear pain, hearing loss, nosebleeds, sinus pain and sore throat.   Eyes: Negative.  Negative for blurred vision, double vision and photophobia.  Respiratory: Positive for cough and shortness of breath. Negative for hemoptysis and sputum production.   Cardiovascular: Negative.  Negative for chest pain, palpitations and leg swelling.  Gastrointestinal: Positive for diarrhea (on and off) and nausea. Negative for abdominal pain, blood in stool, constipation, melena and vomiting.       Colonoscopy in 2014.  Eating well.  Genitourinary: Positive for frequency (at night). Negative for dysuria, hematuria and urgency.  Musculoskeletal: Positive for joint pain (bilateral shoulders secondary to physical activity) and myalgias (bilateral arms secondary to physical activity). Negative for back pain and neck pain.       Whole body pain after physical activity. Rib pain exacerbated by coughing.  Skin: Negative.  Negative for itching and rash.  Neurological: Positive for sensory change (numbness). Negative for dizziness, tingling, speech change, focal weakness, weakness and headaches.  Endo/Heme/Allergies: Does not bruise/bleed easily.       Sarcoidosis.  Psychiatric/Behavioral: Negative.  Negative for depression and memory loss. The patient is not nervous/anxious and does not have insomnia.   All other systems reviewed and are negative.  Performance status (ECOG):  1  Vitals Blood pressure 123/83, pulse 87, temperature (!) 97.5 F (36.4 C), temperature source Tympanic, resp. rate 18, height 5\' 10"  (1.778 m), weight 141 lb 8.6 oz (64.2 kg), SpO2 100 %.   Physical Exam  Constitutional: He is oriented to person, place, and time. He appears well-developed and well-nourished. No distress.  HENT:  Head: Normocephalic and atraumatic.  Short graying hair.   Mask.  Eyes: Conjunctivae and EOM are normal. No scleral icterus.  Glasses.   Hazel/brown eyes.  Neurological: He is alert and oriented to person, place, and time.  Skin: He is not diaphoretic.  Psychiatric: He has a normal mood and affect. His behavior is normal. Judgment and thought content normal.  Nursing note and vitals reviewed.   No visits with results within 3 Day(s) from this visit.  Latest known visit with results is:  Appointment on 12/31/2018  Component Date Value Ref Range Status  . Prostatic Specific Antigen 12/31/2018 1.25  0.00 - 4.00 ng/mL Final   Comment: (NOTE) While PSA levels of <=4.0 ng/ml are reported as reference range, some men with levels below 4.0 ng/ml can have prostate cancer and many men with PSA above 4.0 ng/ml do not have prostate cancer.  Other tests such as free PSA, age specific reference ranges, PSA velocity and PSA doubling time may be helpful especially in men less than 69 years old. Performed at [facility], [place] [address] [place], [place] [address]   . Chromogranin A (ng/mL) 12/31/2018 59.4  0.0 - 101.8 ng/mL Final   Comment: (NOTE) Results of this test are labeled for research purposes only by the assay's manufacturer. The performance characteristics of this assay have not been established by the manufacturer. The result should not be used for treatment or for diagnostic purposes without confirmation of the diagnosis by another medically established diagnostic product or procedure. The performance characteristics were determined by  [facility]. Chromogranin A performed by Thermofisher/BRAHMS KRYPTOR methodology Values obtained with different assay methods or kits cannot be used interchangeably. Performed At: [facility] [place], [place] [id number] Rush Farmer MD [id number]   . aPTT 12/31/2018 31  24 - 36 seconds Final   Performed at [facility], [place] [place], [place] [address]  . HCV Ab 12/31/2018 NON REACTIVE  NON REACTIVE Final   Comment:  (NOTE) Nonreactive HCV antibody screen is consistent with no HCV infections,  unless recent infection is suspected or other evidence exists to indicate HCV infection. Performed at [facility], [place] [address] [place], [place] [address]   . Hepatitis B Surface Ag 12/31/2018 NON REACTIVE  NON REACTIVE Final   Performed at [facility], [place] [address] [place], [place] [address]  . CEA 12/31/2018 5.8* 0.0 - 4.7 ng/mL Final   Comment: (NOTE)                             Nonsmokers          <3.9                             Smokers             <5.6 [employer] Electrochemiluminescence Immunoassay (ECLIA) Values obtained with different assay methods or kits cannot be used interchangeably.  Results cannot be interpreted as absolute evidence of the presence or absence of malignant disease. Performed At: [facility] [place], [place] [id number] Rush Farmer MD [id number]   . AFP, Serum, Tumor Marker 12/31/2018 38,357.0* 0.0 - 8.3 ng/mL Final   Comment: (NOTE) Results confirmed on dilution. [employer] Electrochemiluminescence Immunoassay (ECLIA) Values obtained with different assay methods or kits cannot be used interchangeably.  Results cannot be interpreted as absolute evidence of the presence or absence of malignant disease. This test is not interpretable in pregnant females. Performed At: [facility] [place], [place] [id number] Rush Farmer MD [id number]   . Prothrombin Time 12/31/2018 12.9  11.4 - 15.2 seconds Final  . INR 12/31/2018 1.0  0.8 - 1.2 Final   Comment: (NOTE) INR goal varies based on device and disease states. Performed at [facility], [address], [place], [place] [address]   . Hep B Core Total Ab 12/31/2018 NON REACTIVE  NON REACTIVE Final   Performed at [facility], [place] [address] [place], [place] [address]    Assessment:  Darin Lowery is a 64 y.o. male  with probable metastatic hepatocellular carcinoma.  Chest, abdomen, and pelvis CT on 12/29/2018 revealed a 12 x 12 x 10 cm heterogeneously enhancing mass involving the RIGHT lobe of the liver, likely indicating hepatocellular carcinoma.  There were satellite masses in the POSTERIOR segment RIGHT lobe (1.5 x 1.3 x 1.8 cm) and in the LATERAL segment LEFT lobe of the liver (1.9 x 1.7 x 1.5 cm). There was metastatic lymphadenopathy in the porta hepatis and in the gastrohepatic ligament. There was numerous bilateral pulmonary metastases (largest 12 x 8 mm in the RLL) and metastatic mediastinal lymphadenopathy. There was a 1.4 x 2.3 cm partially necrotic subcarinal node and a 1.7 x 2.3 cm necrotic paracardiac node INFERIOR to the heart.  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  There was no ascites.  There was moderate prostate gland enlargement, particularly the median  lobe. There was a 3.1 cm proximal descending thoracic aortic aneurysm.   Work up on 12/31/2018 revealed an AFP 38,357, CEA 5.7, PSA of 1.25, chromogranin A 59.4.  Hepatitis B core and surface antigen and hepatitis C were negative.  PT/INR was 12.9/1.0 and PTT was 31.  Bone scan on 01/08/2019 showed focal activity in the medial left iliac bone. Depending on clinical concern this could be further evaluated with MRI of the pelvis.Otherwise there was no evidence of malignancy.  [facility] MRI on 01/11/2019 showed cirrhosis.  There was an avidly enhancing dominant poorly marginated 12.3 cm superior liver mass, compatible with locally advanced hepatocellular carcinoma. There were numerous (> 10) additional avidly enhancing liver metastases scattered throughout the liver as detailed. There was porta hepatis and gastrohepatic ligament lymphadenopathy, suspicious for metastatic disease.  There were numerous subcentimeter pulmonary nodules scattered at both lung bases, suspicious for pulmonary metastases. There was trace perihepatic ascites.  There  was moderate to large esophageal varices. There were scattered regions of mild intrahepatic biliary ductal dilatation throughout the liver due to mass-effect from liver masses. There was a normal caliber common bile duct (2 mm diameter).  Child-Pugh score is 6 (class A).   He has a history of sarcoid s/p multiple biopsies (including liver) which revealed granulomas (no report available).  He has a history of blood transfusion in the 1980s.  He has a 60 pack year smoking history.  Symptomatically, he denies any new complaints.  He remains fatigued.  He has lost an additional pound.  Plan: 1.   Metastatic hepatocellular carcinoma             Patient has underlying cirrhosis with varices and no ascites.             He has a history of alcohol use.             Review interval liver MRI.   Imaging reviewed with Dr Pascal Lux of radiology.  Agree with report.   MRI c/w advanced hepatocellular carcinoma.   AFP 38,357 (0-8.3).  Discuss patient's thoughts about confirmatory biopsy.   Patient declines.  Disease is unresectable.  Treatment is palliative.             Discuss treatment option:                         Lenvatinib or atezolizumab + Avastin.  Review clinic studies:                         Lenvatinib or sorafenib.                                     Phase II trial:  46 patients, nenvatinib, PR 37% and SD 41%.  TTP 7.8 months  OS 18.7 months.    REFLECT study: 954 patients    OS 13.6 months vs 12.3 months;  RR 24% vs 9%.  TTP 7.4 months vs 3.7 months.                                     Side effects:  Grade 3/4 HTN 23% vs 14%, HFS 37% vs 52%, alopecia 25% vs 3%.    Other: increased QTc, GI toxicity (diarrhea, perforation/fistula, elevated LFTs), bleeding, proteinuria, electrolyte issues.  Atezolizumab + Avastin (Child-Pugh Class A)                                     As compared to sorafenib: 12 month survival 67% vs 55%, PFS 6.8 vs 4.3 months, RR 27% vs 12%.                                      Side effects:  HTN, increased LFTs, proteinuria, bleeding/thrombosis, bowel perforation  Discuss management of esophageal varices.              Referral to GI.  Discuss chemotherapy class. 3.   Bone pain             Patient describes pain in ribs and upper arms with activity.  Bone scan revealed focal activity in the medial left iliac bone.    Significance unclear.   Consider future MRI of pelvis.  If bone metastasis documented, will pursue Xgeva. 4.   Enlarged prostate             PSA was 1.25.  Urinary frequency likely secondary to BPH. 5.   Nausea  Rx:  ondansetron 8 mg po q 8 hours prn nausea (dis: #20). 6.   RN to assess veins for treatment.  Veins adequate for treatment. 7.   Preauth atezolizumab + Avastin. 8.   Schedule chemotherapy class. 9.   Consult GI (Dr Allen Norris). 10.   RTC in 1 week for MD assessment, labs (CBC with diff, CMP, Mg, AFP), and cycle #1  atezolizumab + Avastin.  I discussed the assessment and treatment plan with the patient.  The patient was provided an opportunity to ask questions and all were answered.  The patient agreed with the plan and demonstrated an understanding of the instructions.  The patient was advised to call back if the symptoms worsen or if the condition fails to improve as anticipated.  I provided 40 minutes (8:50 AM - 9:30 AM) of face-to-face time during this encounter and > 50% was spent counseling as documented under my assessment and plan.   Additional time was spent reviewing imaging with radiology.   Lequita Asal, MD, PhD    01/15/2019, 9:30 AM  I, Samul Dada, am acting as a scribe for Lequita Asal, MD.  I, [employer] Mike Gip, MD, have reviewed the above documentation for accuracy and completeness, and I agree with the above.

## 2019-01-15 ENCOUNTER — Other Ambulatory Visit: Payer: Self-pay

## 2019-01-15 ENCOUNTER — Encounter: Payer: Self-pay | Admitting: Hematology and Oncology

## 2019-01-15 ENCOUNTER — Inpatient Hospital Stay: Payer: Commercial Managed Care - PPO | Attending: Hematology and Oncology | Admitting: Hematology and Oncology

## 2019-01-15 VITALS — BP 123/83 | HR 87 | Temp 97.5°F | Resp 18 | Ht 70.0 in | Wt 141.5 lb

## 2019-01-15 DIAGNOSIS — Z7189 Other specified counseling: Secondary | ICD-10-CM

## 2019-01-15 DIAGNOSIS — Z7289 Other problems related to lifestyle: Secondary | ICD-10-CM | POA: Diagnosis not present

## 2019-01-15 DIAGNOSIS — M898X9 Other specified disorders of bone, unspecified site: Secondary | ICD-10-CM | POA: Diagnosis not present

## 2019-01-15 DIAGNOSIS — N400 Enlarged prostate without lower urinary tract symptoms: Secondary | ICD-10-CM | POA: Insufficient documentation

## 2019-01-15 DIAGNOSIS — K5900 Constipation, unspecified: Secondary | ICD-10-CM | POA: Diagnosis not present

## 2019-01-15 DIAGNOSIS — K7460 Unspecified cirrhosis of liver: Secondary | ICD-10-CM | POA: Insufficient documentation

## 2019-01-15 DIAGNOSIS — F17210 Nicotine dependence, cigarettes, uncomplicated: Secondary | ICD-10-CM | POA: Insufficient documentation

## 2019-01-15 DIAGNOSIS — R918 Other nonspecific abnormal finding of lung field: Secondary | ICD-10-CM

## 2019-01-15 DIAGNOSIS — R7989 Other specified abnormal findings of blood chemistry: Secondary | ICD-10-CM | POA: Insufficient documentation

## 2019-01-15 DIAGNOSIS — I8390 Asymptomatic varicose veins of unspecified lower extremity: Secondary | ICD-10-CM | POA: Diagnosis not present

## 2019-01-15 DIAGNOSIS — R110 Nausea: Secondary | ICD-10-CM | POA: Diagnosis not present

## 2019-01-15 DIAGNOSIS — G893 Neoplasm related pain (acute) (chronic): Secondary | ICD-10-CM | POA: Diagnosis not present

## 2019-01-15 DIAGNOSIS — C220 Liver cell carcinoma: Secondary | ICD-10-CM | POA: Insufficient documentation

## 2019-01-15 MED ORDER — ONDANSETRON HCL 8 MG PO TABS: 8.0000 mg | ORAL_TABLET | Freq: Three times a day (TID) | ORAL | 0 refills | Status: AC | PRN

## 2019-01-15 NOTE — Patient Instructions (Signed)
Liver Cancer  Liver cancer is an abnormal growth of cells (tumor) in the liver. The liver is located on the upper right side of the abdomen, just below the ribs. What are the causes? The exact cause of this condition is not known. What increases the risk? You are more likely to develop this condition if you:  Are a male.  Have scarring of the liver (cirrhosis). Cirrhosis may be caused by: ? Too much alcohol use. ? Hepatitis B or hepatitis C. ? Smoking.  Have diabetes.  Have a condition in which the body stores too much iron (hemochromatosis).  Have a buildup of fat in the liver without alcohol use (nonalcoholic fatty liver disease).  Are exposed to aflatoxins. These are substances made by certain types of mold that grow on food products, such as corn and peanuts.  Use drugs that build muscles (anabolic steroids) or medicines that prevent pregnancy (oral contraceptives).  Have a disease caused by parasitic flatworms (schistosomiasis).  Are obese. What are the signs or symptoms? In some cases, there are few or no symptoms in the beginning. As the disease progresses, symptoms may include:  Weight loss.  Loss of appetite.  Nausea or vomiting.  Feeling itchy.  Abnormal bruising or bleeding.  Feeling very weak and tired.  Pain on the right side of your abdomen, shoulder, or back.  Skin or eyes that look yellow in color (jaundice).  Dark-colored urine.  White, chalk-like stools. Liver cancer may cause other medical conditions to develop, such as:  Hypercalcemia. This is high calcium levels in the blood, which may cause weakness, constipation, or confusion.  Hypoglycemia. This is low blood sugar, which may cause fatigue, confusion, and sweating.  Enlarged breasts in men (gynecomastia).  Small testicles in men.  Looking and feeling flushed due to a high amount of red blood cells in the body.  High levels of fat in the blood (cholesterol). How is this  diagnosed? This condition is diagnosed with a medical history and physical exam. You may also have tests, including:  Blood tests.  Imaging tests, such as: ? CT scan. ? MRI. ? Ultrasound. ? Bone scan.  Laparoscopy. A small, lighted camera (laparoscope) is used to look at your liver and other organs.  Biopsy. Samples of tissue from the liver are taken and tested in a lab. If liver cancer is confirmed, it will be staged to determine its severity and extent. Staging checks:  The size of the tumor.  If the cancer has spread.  Where the cancer has spread. How is this treated? Treatment for this condition depends on the type and stage of the cancer, and your overall health. Treatment may include:  Surgery to remove the cancer cells. Sometimes the entire liver is removed and replaced with a healthy liver (liver transplant).  Chemotherapy. Medicines are used to kill the cancer cells.  Targeted therapy. This targets specific parts of cancer cells and nearby areas to block the growth and spread of the cancer.  Immunotherapy, also called biological therapy. This strengthens your body's defense (immune) system to destroy or stop the growth and spread of cancer cells.  Radiation therapy. This uses high-energy X-rays to kill the cancer cells.  Ablation. This destroys the tumor cells using high-energy radio waves, cold therapy, heat therapy, or a type of alcohol (ethanol).  Embolization. This is a procedure that blocks the blood flow to the tumor in the liver.  Chemoembolization. This combines embolization with tiny beads that deliver chemotherapy directly  to the site of the tumor.  Radioembolization. This combines embolization with tiny beads that deliver radiation directly to the site of the tumor. Follow these instructions at home: Medicines  Take over-the-counter and prescription medicines only as told by your health care provider.  Ask your health care provider about: ? Changing  or stopping your regular medicines. This is especially important if you are taking diabetes medicines or blood thinners. ? Taking medicines such as aspirin and ibuprofen. These medicines can thin your blood. Do not take these medicines unless your health care provider tells you to take them. ? Taking over-the-counter medicines, vitamins, herbs, and supplements. Lifestyle   Do not drink alcohol.  Do not use any products that contain nicotine or tobacco, such as cigarettes and e-cigarettes. If you need help quitting, ask your health care provider.  Consider joining a cancer support group. Ask your health care provider for more information about local and online support groups. This may help you learn to cope with the stress of having liver cancer. General instructions  Talk to your health care provider about the side-effects of treatment and the best way to manage them.  Keep all follow-up visits as told by your health care provider. This is important. Where to find more information  American Cancer Society: www.cancer.[place]: www.cancer.gov Contact a health care provider if:  You cannot eat because you feel nauseous or are vomiting.  You feel weaker or more tired than usual.  Your pain gets worse.  You feel depressed or anxious. Get help right away if:  You feel confused.  Pain in your abdomen increases suddenly.  Your abdomen or legs start to swell.  You have a fever, chills, or body aches.  You notice unusual bleeding or bleeding that does not stop quickly.  You have maroon, black, or bloody stools.  Your eyes or skin become more yellow in color. These symptoms may represent a serious problem that is an emergency. Do not wait to see if the symptoms will go away. Get medical help right away. Call your local emergency services (911 in the [place] Do not drive yourself to the hospital. Summary  Liver cancer is an abnormal growth of cells (tumor) in  the liver that is cancerous (malignant).  Treatment for liver cancer depends on the type and stage of the cancer, and your overall health.  Talk to your health care provider about the side-effects of treatment and the best way to manage them.  Keep all follow-up visits as told by your health care provider. This is important. This information is not intended to replace advice given to you by your health care provider. Make sure you discuss any questions you have with your health care provider. Document Revised: 09/18/2017 Document Reviewed: 09/18/2017 Elsevier Patient Education  [place] oral capsule What is this medicine? LENVATINIB (len VA ti nib) is a medicine that targets proteins in cancer cells and stops the cancer cells from growing. It is used to treat endometrial cancer, kidney cancer, liver cancer, and thyroid cancer. This medicine may be used for other purposes; ask your health care provider or pharmacist if you have questions. COMMON BRAND NAME(S): Maribel What should I tell my health care provider before I take this medicine? They need to know if you have any of these conditions:  diabetes  high blood pressure  heart disease  history of blood clots  history of irregular heartbeat  history of low levels of calcium, magnesium,  or potassium in the blood  inflammatory bowel disease  kidney disease  liver disease  protein in your urine  recent or planning to have surgery  thyroid disease  an unusual or allergic reaction to lenvatinib, other medicines, foods, dyes, or preservatives  pregnant or trying to get pregnant  breast-feeding How should I use this medicine? Take this medicine by mouth with a glass of water. Follow the directions on the prescription label. Swallow the capsules whole. Do not cut, crush or chew this medicine. Take your medicine at regular intervals. Do not take it more often than directed. Do not stop taking except on  your doctor's advice. Talk to your pediatrician regarding the use of this medicine in children. Special care may be needed. Overdosage: If you think you have taken too much of this medicine contact a poison control center or emergency room at once. NOTE: This medicine is only for you. Do not share this medicine with others. What if I miss a dose? If you miss a dose, take it as soon as you can. If your next dose is to be taken in less than 12 hours, then do not take the missed dose. Take the next dose at your regular time. Do not take double or extra doses. What may interact with this medicine? Do not take this medicine with any of the following medications:  cisapride  dronedarone  pimozide  thioridazine This medicine may also interact with the following medications:  alfuzosin  bedaquiline  certain antibiotics like azithromycin, clarithromycin or erythromycin  certain medicines for bladder problems like solifenacin, tolterodine  certain medicines for depression, anxiety, or psychotic disturbances  certain medicines for fungal infections like ketoconazole, posaconazole, voriconazole, fluconazole, and itraconazole  certain medicines for irregular heart beat like amiodarone, dofetilide, flecainide, propafenone, quinidine  chloroquine  ciprofloxacin  dofetilide  ezogabine  fingolimod  granisetron  leuprolide  lopinavir; ritonavir  methadone  metronidazole  mifepristone  octreotide  ondansetron  other medicines that prolong the QT interval (cause an abnormal heart rhythm) like ziprasidone  pasireotide  pentamidine  promethazine  quinine  ranolazine  rifampin  rilpivirine  romidepsin  saquinavir  tacrolimus  telavancin  telithromycin  tetrabenazine  tizanidine  toremifene  vardenafil  vorinostat This list may not describe all possible interactions. Give your health care provider a list of all the medicines, herbs,  non-prescription drugs, or dietary supplements you use. Also tell them if you smoke, drink alcohol, or use illegal drugs. Some items may interact with your medicine. What should I watch for while using this medicine? Visit your doctor for regular check-ups. Report any side effects. Continue your course of treatment unless your doctor tells you to stop. You will need blood work done while you are taking this medicine. Do not become pregnant while taking this medicine or for 30 days after stopping it. Women should inform their doctor if they wish to become pregnant or think they might be pregnant. There is a potential for serious side effects to an unborn child. Talk to your health care professional or pharmacist for more information. Do not breast-feed an infant while taking this medicine or for 1 week after stopping it. This medicine may interfere with the ability to have a child. Talk with your doctor or health care professional if you are concerned about your fertility. Before having surgery, talk to your health care provider to make sure it is ok. This drug can increase the risk of poor healing of your surgical site or  wound. You will need to stop this drug for 1 week before surgery. After surgery, wait at least 2 weeks before restarting this drug. Make sure the surgical site or wound is healed enough before restarting this drug. Talk to your health care provider if questions. Be careful brushing and flossing your teeth or using a toothpick because you may get an infection or bleed more easily. If you have any dental work done, tell your dentist you are receiving this medicine. This medicine may increase your risk to bruise or bleed. Call your doctor or health care professional if you notice any unusual bleeding. This drug may make you feel generally unwell. This is not uncommon, as chemotherapy can affect healthy cells as well as cancer cells. Report any side effects. Continue your course of treatment  even though you feel ill unless your doctor tells you to stop. This medicine may interfere with the ability to have a child. You should talk with your doctor or health care professional if you are concerned about your fertility. What side effects may I notice from receiving this medicine? Side effects that you should report to your doctor or health care professional as soon as possible:  allergic reactions like skin rash, itching or hives, swelling of the face, lips, or tongue  breathing problems  chest pain or palpitations  dizziness  feeling faint or lightheaded, falls  headache  high blood pressure  seizures  signs and symptoms of bleeding such as bloody or black, tarry stools; red or dark-brown urine; spitting up blood or brown material that looks like coffee grounds; red spots on the skin; unusual bruising or bleeding from the eye, gums, or nose  signs and symptoms of a dangerous change in heartbeat or heart rhythm like chest pain; dizziness; fast or irregular heartbeat; palpitations; feeling faint or lightheaded, falls; breathing problems  signs and symptoms of kidney injury like trouble passing urine or change in the amount of urine  signs and symptoms of liver injury like dark yellow or brown urine; general ill feeling or flu-like symptoms; light-colored stools; loss of appetite; nausea; right upper belly pain; unusually weak or tired; yellowing of the eyes or skin  signs and symptoms of low potassium like muscle cramps or muscle pain; chest pain; dizziness; feeling faint or lightheaded, falls; palpitations; breathing problems; or fast, irregular heartbeat  signs and symptoms of a stroke like changes in vision; confusion; trouble speaking or understanding; severe headaches; sudden numbness or weakness of the face, arm or leg; trouble walking; dizziness; loss of balance or coordination  stomach pain  swelling of the legs or ankles  unusually weak or tired Side effects  that usually do not require medical attention (report to your doctor or health care professional if they continue or are bothersome):  diarrhea  joint pain  loss of appetite  mouth sores  muscle pain  nausea, vomiting  weight loss This list may not describe all possible side effects. Call your doctor for medical advice about side effects. You may report side effects to FDA at 1-800-FDA-1088. Where should I keep my medicine? Keep out of the reach of children. Store between 20 and 25 degrees C (68 and 77 degrees F). Throw away any unused medicine after the expiration date. NOTE: This sheet is a summary. It may not cover all possible information. If you have questions about this medicine, talk to your doctor, pharmacist, or health care provider.  2020 Elsevier/Gold Standard (2018-10-16 11:11:13) Atezolizumab injection What is this medicine?  ATEZOLIZUMAB (a te zoe LIZ ue mab) is a monoclonal antibody. It is used to treat bladder cancer (urothelial cancer), liver cancer, lung cancer, breast cancer, and melanoma. This medicine may be used for other purposes; ask your health care provider or pharmacist if you have questions. COMMON BRAND NAME(S): Tecentriq What should I tell my health care provider before I take this medicine? They need to know if you have any of these conditions:  diabetes  immune system problems  infection  inflammatory bowel disease  liver disease  lung or breathing disease  lupus  nervous system problems like myasthenia gravis or Guillain-Barre syndrome  organ transplant  an unusual or allergic reaction to atezolizumab, other medicines, foods, dyes, or preservatives  pregnant or trying to get pregnant  breast-feeding How should I use this medicine? This medicine is for infusion into a vein. It is given by a health care professional in a hospital or clinic setting. A special MedGuide will be given to you before each treatment. Be sure to read this  information carefully each time. Talk to your pediatrician regarding the use of this medicine in children. Special care may be needed. Overdosage: If you think you have taken too much of this medicine contact a poison control center or emergency room at once. NOTE: This medicine is only for you. Do not share this medicine with others. What if I miss a dose? It is important not to miss your dose. Call your doctor or health care professional if you are unable to keep an appointment. What may interact with this medicine? Interactions have not been studied. This list may not describe all possible interactions. Give your health care provider a list of all the medicines, herbs, non-prescription drugs, or dietary supplements you use. Also tell them if you smoke, drink alcohol, or use illegal drugs. Some items may interact with your medicine. What should I watch for while using this medicine? Your condition will be monitored carefully while you are receiving this medicine. You may need blood work done while you are taking this medicine. Do not become pregnant while taking this medicine or for at least 5 months after stopping it. Women should inform their doctor if they wish to become pregnant or think they might be pregnant. There is a potential for serious side effects to an unborn child. Talk to your health care professional or pharmacist for more information. Do not breast-feed an infant while taking this medicine or for at least 5 months after the last dose. What side effects may I notice from receiving this medicine? Side effects that you should report to your doctor or health care professional as soon as possible:  allergic reactions like skin rash, itching or hives, swelling of the face, lips, or tongue  black, tarry stools  bloody or watery diarrhea  breathing problems  changes in vision  chest pain or chest tightness  chills  facial flushing  fever  headache  signs and symptoms  of high blood sugar such as dizziness; dry mouth; dry skin; fruity breath; nausea; stomach pain; increased hunger or thirst; increased urination  signs and symptoms of liver injury like dark yellow or brown urine; general ill feeling or flu-like symptoms; light-colored stools; loss of appetite; nausea; right upper belly pain; unusually weak or tired; yellowing of the eyes or skin  stomach pain  trouble passing urine or change in the amount of urine Side effects that usually do not require medical attention (report to your doctor or health care  professional if they continue or are bothersome):  bone pain  cough  diarrhea  joint pain  muscle pain  muscle weakness  swelling of arms or legs  tiredness  weight loss This list may not describe all possible side effects. Call your doctor for medical advice about side effects. You may report side effects to FDA at 1-800-FDA-1088. Where should I keep my medicine? This drug is given in a hospital or clinic and will not be stored at home. NOTE: This sheet is a summary. It may not cover all possible information. If you have questions about this medicine, talk to your doctor, pharmacist, or health care provider.  2020 Elsevier/Gold Standard (2018-08-09 13:11:14) Bevacizumab injection What is this medicine? BEVACIZUMAB (be va SIZ yoo mab) is a monoclonal antibody. It is used to treat many types of cancer. This medicine may be used for other purposes; ask your health care provider or pharmacist if you have questions. COMMON BRAND NAME(S): Avastin, MVASI, Zirabev What should I tell my health care provider before I take this medicine? They need to know if you have any of these conditions:  diabetes  heart disease  high blood pressure  history of coughing up blood  prior anthracycline chemotherapy (e.g., doxorubicin, daunorubicin, epirubicin)  recent or ongoing radiation therapy  recent or planning to have surgery  stroke  an  unusual or allergic reaction to bevacizumab, hamster proteins, mouse proteins, other medicines, foods, dyes, or preservatives  pregnant or trying to get pregnant  breast-feeding How should I use this medicine? This medicine is for infusion into a vein. It is given by a health care professional in a hospital or clinic setting. Talk to your pediatrician regarding the use of this medicine in children. Special care may be needed. Overdosage: If you think you have taken too much of this medicine contact a poison control center or emergency room at once. NOTE: This medicine is only for you. Do not share this medicine with others. What if I miss a dose? It is important not to miss your dose. Call your doctor or health care professional if you are unable to keep an appointment. What may interact with this medicine? Interactions are not expected. This list may not describe all possible interactions. Give your health care provider a list of all the medicines, herbs, non-prescription drugs, or dietary supplements you use. Also tell them if you smoke, drink alcohol, or use illegal drugs. Some items may interact with your medicine. What should I watch for while using this medicine? Your condition will be monitored carefully while you are receiving this medicine. You will need important blood work and urine testing done while you are taking this medicine. This medicine may increase your risk to bruise or bleed. Call your doctor or health care professional if you notice any unusual bleeding. Before having surgery, talk to your health care provider to make sure it is ok. This drug can increase the risk of poor healing of your surgical site or wound. You will need to stop this drug for 28 days before surgery. After surgery, wait at least 28 days before restarting this drug. Make sure the surgical site or wound is healed enough before restarting this drug. Talk to your health care provider if questions. Do not  become pregnant while taking this medicine or for 6 months after stopping it. Women should inform their doctor if they wish to become pregnant or think they might be pregnant. There is a potential for serious side effects  to an unborn child. Talk to your health care professional or pharmacist for more information. Do not breast-feed an infant while taking this medicine and for 6 months after the last dose. This medicine has caused ovarian failure in some women. This medicine may interfere with the ability to have a child. You should talk to your doctor or health care professional if you are concerned about your fertility. What side effects may I notice from receiving this medicine? Side effects that you should report to your doctor or health care professional as soon as possible:  allergic reactions like skin rash, itching or hives, swelling of the face, lips, or tongue  chest pain or chest tightness  chills  coughing up blood  high fever  seizures  severe constipation  signs and symptoms of bleeding such as bloody or black, tarry stools; red or dark-brown urine; spitting up blood or brown material that looks like coffee grounds; red spots on the skin; unusual bruising or bleeding from the eye, gums, or nose  signs and symptoms of a blood clot such as breathing problems; chest pain; severe, sudden headache; pain, swelling, warmth in the leg  signs and symptoms of a stroke like changes in vision; confusion; trouble speaking or understanding; severe headaches; sudden numbness or weakness of the face, arm or leg; trouble walking; dizziness; loss of balance or coordination  stomach pain  sweating  swelling of legs or ankles  vomiting  weight gain Side effects that usually do not require medical attention (report to your doctor or health care professional if they continue or are bothersome):  back pain  changes in taste  decreased appetite  dry skin  nausea  tiredness This  list may not describe all possible side effects. Call your doctor for medical advice about side effects. You may report side effects to FDA at 1-800-FDA-1088. Where should I keep my medicine? This drug is given in a hospital or clinic and will not be stored at home. NOTE: This sheet is a summary. It may not cover all possible information. If you have questions about this medicine, talk to your doctor, pharmacist, or health care provider.  2020 Elsevier/Gold Standard (2018-10-16 10:50:46)

## 2019-01-15 NOTE — Progress Notes (Signed)
No new changes noted today 

## 2019-01-15 NOTE — Progress Notes (Signed)
START OFF PATHW[facility] REGIMEN - Other   [id number]:Atezolizumab 1,200 mg IV D1 + Bevacizumab 15 mg/kg IV D1 q21 Days:   A cycle is every 21 Days:     Atezolizumab      Bevacizumab-xxxx   **Always confirm dose/schedule in your pharmacy ordering system**  Patient Characteristics: Intent of Therapy: Non-Curative / Palliative Intent, Discussed with Patient 

## 2019-01-17 NOTE — Patient Instructions (Signed)
Atezolizumab injection What is this medicine? ATEZOLIZUMAB (a te zoe LIZ ue mab) is a monoclonal antibody. It is used to treat bladder cancer (urothelial cancer), liver cancer, lung cancer, breast cancer, and melanoma. This medicine may be used for other purposes; ask your health care provider or pharmacist if you have questions. COMMON BRAND NAME(S): Tecentriq What should I tell my health care provider before I take this medicine? They need to know if you have any of these conditions:  diabetes  immune system problems  infection  inflammatory bowel disease  liver disease  lung or breathing disease  lupus  nervous system problems like myasthenia gravis or Guillain-Barre syndrome  organ transplant  an unusual or allergic reaction to atezolizumab, other medicines, foods, dyes, or preservatives  pregnant or trying to get pregnant  breast-feeding How should I use this medicine? This medicine is for infusion into a vein. It is given by a health care professional in a hospital or clinic setting. A special MedGuide will be given to you before each treatment. Be sure to read this information carefully each time. Talk to your pediatrician regarding the use of this medicine in children. Special care may be needed. Overdosage: If you think you have taken too much of this medicine contact a poison control center or emergency room at once. NOTE: This medicine is only for you. Do not share this medicine with others. What if I miss a dose? It is important not to miss your dose. Call your doctor or health care professional if you are unable to keep an appointment. What may interact with this medicine? Interactions have not been studied. This list may not describe all possible interactions. Give your health care provider a list of all the medicines, herbs, non-prescription drugs, or dietary supplements you use. Also tell them if you smoke, drink alcohol, or use illegal drugs. Some items may  interact with your medicine. What should I watch for while using this medicine? Your condition will be monitored carefully while you are receiving this medicine. You may need blood work done while you are taking this medicine. Do not become pregnant while taking this medicine or for at least 5 months after stopping it. Women should inform their doctor if they wish to become pregnant or think they might be pregnant. There is a potential for serious side effects to an unborn child. Talk to your health care professional or pharmacist for more information. Do not breast-feed an infant while taking this medicine or for at least 5 months after the last dose. What side effects may I notice from receiving this medicine? Side effects that you should report to your doctor or health care professional as soon as possible:  allergic reactions like skin rash, itching or hives, swelling of the face, lips, or tongue  black, tarry stools  bloody or watery diarrhea  breathing problems  changes in vision  chest pain or chest tightness  chills  facial flushing  fever  headache  signs and symptoms of high blood sugar such as dizziness; dry mouth; dry skin; fruity breath; nausea; stomach pain; increased hunger or thirst; increased urination  signs and symptoms of liver injury like dark yellow or brown urine; general ill feeling or flu-like symptoms; light-colored stools; loss of appetite; nausea; right upper belly pain; unusually weak or tired; yellowing of the eyes or skin  stomach pain  trouble passing urine or change in the amount of urine Side effects that usually do not require medical attention (report to  your doctor or health care professional if they continue or are bothersome):  bone pain  cough  diarrhea  joint pain  muscle pain  muscle weakness  swelling of arms or legs  tiredness  weight loss This list may not describe all possible side effects. Call your doctor for  medical advice about side effects. You may report side effects to FDA at 1-800-FDA-1088. Where should I keep my medicine? This drug is given in a hospital or clinic and will not be stored at home. NOTE: This sheet is a summary. It may not cover all possible information. If you have questions about this medicine, talk to your doctor, pharmacist, or health care provider.  2020 [employer] Standard (2018-08-09 13:11:14) Bevacizumab injection What is this medicine? BEVACIZUMAB (be va SIZ yoo mab) is a monoclonal antibody. It is used to treat many types of cancer. This medicine may be used for other purposes; ask your health care provider or pharmacist if you have questions. COMMON BRAND NAME(S): Avastin, MVASI, Zirabev What should I tell my health care provider before I take this medicine? They need to know if you have any of these conditions:  diabetes  heart disease  high blood pressure  history of coughing up blood  prior anthracycline chemotherapy (e.g., doxorubicin, daunorubicin, epirubicin)  recent or ongoing radiation therapy  recent or planning to have surgery  stroke  an unusual or allergic reaction to bevacizumab, hamster proteins, mouse proteins, other medicines, foods, dyes, or preservatives  pregnant or trying to get pregnant  breast-feeding How should I use this medicine? This medicine is for infusion into a vein. It is given by a health care professional in a hospital or clinic setting. Talk to your pediatrician regarding the use of this medicine in children. Special care may be needed. Overdosage: If you think you have taken too much of this medicine contact a poison control center or emergency room at once. NOTE: This medicine is only for you. Do not share this medicine with others. What if I miss a dose? It is important not to miss your dose. Call your doctor or health care professional if you are unable to keep an appointment. What may interact with this  medicine? Interactions are not expected. This list may not describe all possible interactions. Give your health care provider a list of all the medicines, herbs, non-prescription drugs, or dietary supplements you use. Also tell them if you smoke, drink alcohol, or use illegal drugs. Some items may interact with your medicine. What should I watch for while using this medicine? Your condition will be monitored carefully while you are receiving this medicine. You will need important blood work and urine testing done while you are taking this medicine. This medicine may increase your risk to bruise or bleed. Call your doctor or health care professional if you notice any unusual bleeding. Before having surgery, talk to your health care provider to make sure it is ok. This drug can increase the risk of poor healing of your surgical site or wound. You will need to stop this drug for 28 days before surgery. After surgery, wait at least 28 days before restarting this drug. Make sure the surgical site or wound is healed enough before restarting this drug. Talk to your health care provider if questions. Do not become pregnant while taking this medicine or for 6 months after stopping it. Women should inform their doctor if they wish to become pregnant or think they might be pregnant. There is a  potential for serious side effects to an unborn child. Talk to your health care professional or pharmacist for more information. Do not breast-feed an infant while taking this medicine and for 6 months after the last dose. This medicine has caused ovarian failure in some women. This medicine may interfere with the ability to have a child. You should talk to your doctor or health care professional if you are concerned about your fertility. What side effects may I notice from receiving this medicine? Side effects that you should report to your doctor or health care professional as soon as possible:  allergic reactions like skin  rash, itching or hives, swelling of the face, lips, or tongue  chest pain or chest tightness  chills  coughing up blood  high fever  seizures  severe constipation  signs and symptoms of bleeding such as bloody or black, tarry stools; red or dark-brown urine; spitting up blood or brown material that looks like coffee grounds; red spots on the skin; unusual bruising or bleeding from the eye, gums, or nose  signs and symptoms of a blood clot such as breathing problems; chest pain; severe, sudden headache; pain, swelling, warmth in the leg  signs and symptoms of a stroke like changes in vision; confusion; trouble speaking or understanding; severe headaches; sudden numbness or weakness of the face, arm or leg; trouble walking; dizziness; loss of balance or coordination  stomach pain  sweating  swelling of legs or ankles  vomiting  weight gain Side effects that usually do not require medical attention (report to your doctor or health care professional if they continue or are bothersome):  back pain  changes in taste  decreased appetite  dry skin  nausea  tiredness This list may not describe all possible side effects. Call your doctor for medical advice about side effects. You may report side effects to FDA at 1-800-FDA-1088. Where should I keep my medicine? This drug is given in a hospital or clinic and will not be stored at home. NOTE: This sheet is a summary. It may not cover all possible information. If you have questions about this medicine, talk to your doctor, pharmacist, or health care provider.  2020 [employer] Standard (2018-10-16 10:50:46)  

## 2019-01-20 ENCOUNTER — Inpatient Hospital Stay (HOSPITAL_BASED_OUTPATIENT_CLINIC_OR_DEPARTMENT_OTHER): Payer: Commercial Managed Care - PPO | Admitting: Oncology

## 2019-01-20 ENCOUNTER — Inpatient Hospital Stay: Payer: Commercial Managed Care - PPO

## 2019-01-20 ENCOUNTER — Other Ambulatory Visit: Payer: Self-pay

## 2019-01-20 DIAGNOSIS — G893 Neoplasm related pain (acute) (chronic): Secondary | ICD-10-CM

## 2019-01-20 DIAGNOSIS — C220 Liver cell carcinoma: Secondary | ICD-10-CM

## 2019-01-20 MED ORDER — OXYCODONE HCL 5 MG PO TABS: 5.0000 mg | ORAL_TABLET | Freq: Three times a day (TID) | ORAL | 0 refills | Status: DC | PRN

## 2019-01-20 NOTE — Progress Notes (Signed)
Darin Lowery  [phone] [phone]  Patient Care Team: Sallee Lange, NP as PCP - General (Internal Medicine) Lequita Asal, MD as Referring Physician (Hematology and Oncology) Lucilla Lame, MD as Consulting Physician (Gastroenterology)   Name of the patient: Darin Lowery  [id number]  12/03/1955   Date of visit: 01/20/19  Diagnosis-metastatic hepatocellular carcinoma  I connected with Darin Lowery on 01/20/19 at  9:30 AM EST by telephone visit and verified that I am speaking with the correct person using two identifiers.   I discussed the limitations, risks, security and privacy concerns of performing an evaluation and management service by telemedicine and the availability of in-person appointments. I also discussed with the patient that there may be a patient responsible charge related to this service. The patient expressed understanding and agreed to proceed.   Other persons participating in the visit and their role in the encounter: Wife Darin Lowery  Patient's location: Home Provider's location: Home  Chief complaint/Reason for visit- Initial Meeting for [facility], preparing for starting chemotherapy  Heme/Onc history:  Oncology History  Hepatocellular carcinoma ([place])  01/15/2019 Initial Diagnosis   Hepatocellular carcinoma ([place])   01/23/2019 -  Chemotherapy   The patient had atezolizumab (TECENTRIQ) 1,200 mg in sodium chloride 0.9 % 250 mL chemo infusion, 1,200 mg, Intravenous, Once, 0 of 6 cycles bevacizumab-bvzr (ZIRABEV) 1,000 mg in sodium chloride 0.9 % 100 mL chemo infusion, 15 mg/kg = 1,000 mg, Intravenous,  Once, 0 of 6 cycles  for chemotherapy treatment.      Interval history-Darin Lowery is a 64 year old male who presents to chemo care clinic today for initial meeting in preparation for starting chemotherapy. I introduced the chemo care clinic and we discussed  that the role of the clinic is to assist those who are at an increased risk of emergency room visits and/or complications during the course of chemotherapy treatment. We discussed that the increased risk takes into account factors such as age, performance status, and co-morbidities. We also discussed that for some, this might include barriers to care such as not having a primary care provider, lack of insurance/transportation, or not being able to afford medications. We discussed that the goal of the program is to help prevent unplanned ER visits and help reduce complications during chemotherapy. We do this by discussing specific risk factors to each individual and identifying ways that we can help improve these risk factors and reduce barriers to care.   ECOG FS:1 - Symptomatic but completely ambulatory  Review of systems- Review of Systems  Constitutional: Positive for malaise/fatigue and weight loss. Negative for chills and fever.  HENT: Negative for congestion, ear pain and tinnitus.   Eyes: Negative.  Negative for blurred vision and double vision.  Respiratory: Negative.  Negative for cough, sputum production and shortness of breath.   Cardiovascular: Negative.  Negative for chest pain, palpitations and leg swelling.  Gastrointestinal: Positive for abdominal pain. Negative for constipation, diarrhea, nausea and vomiting.  Genitourinary: Negative for dysuria, frequency and urgency.  Musculoskeletal: Positive for back pain. Negative for falls.  Skin: Negative.  Negative for rash.  Neurological: Negative.  Negative for weakness and headaches.  Endo/Heme/Allergies: Negative.  Does not bruise/bleed easily.  Psychiatric/Behavioral: Negative.  Negative for depression. The patient is not nervous/anxious and does not have insomnia.      Current treatment-scheduled to begin lenvatinib plus Avastin  No Known Allergies  Past Medical History:  Diagnosis Date  .  Sarcoidosis     Past Surgical  History:  Procedure Laterality Date  . APPENDECTOMY    . SPLENECTOMY      Social History   Socioeconomic History  . Marital status: Married    Spouse name: Not on file  . Number of children: Not on file  . Years of education: Not on file  . Highest education level: Not on file  Occupational History  . Not on file  Tobacco Use  . Smoking status: Current Every Day Smoker    Types: Cigarettes  . Smokeless tobacco: Never Used  Substance and Sexual Activity  . Alcohol use: Yes    Alcohol/week: 6.0 standard drinks    Types: 6 Cans of beer per week  . Drug use: Not Currently  . Sexual activity: Not on file  Other Topics Concern  . Not on file  Social History Narrative  . Not on file   Social Determinants of Health   Financial Resource Strain:   . Difficulty of Paying Living Expenses: Not on file  Food Insecurity:   . Worried About Charity fundraiser in the Last Year: Not on file  . Ran Out of Food in the Last Year: Not on file  Transportation Needs:   . Lack of Transportation (Medical): Not on file  . Lack of Transportation (Non-Medical): Not on file  Physical Activity:   . Days of Exercise per Week: Not on file  . Minutes of Exercise per Session: Not on file  Stress:   . Feeling of Stress : Not on file  Social Connections:   . Frequency of Communication with Friends and Family: Not on file  . Frequency of Social Gatherings with Friends and Family: Not on file  . Attends Religious Services: Not on file  . Active Member of Clubs or Organizations: Not on file  . Attends Archivist Meetings: Not on file  . Marital Status: Not on file  Intimate Partner Violence:   . Fear of Current or Ex-Partner: Not on file  . Emotionally Abused: Not on file  . Physically Abused: Not on file  . Sexually Abused: Not on file    Family History  Problem Relation Age of Onset  . Liver disease Father      Current Outpatient Medications:  .  ondansetron (ZOFRAN) 8 MG  tablet, Take 1 tablet (8 mg total) by mouth every 8 (eight) hours as needed for nausea or vomiting., Disp: 20 tablet, Rfl: 0 .  oxyCODONE (OXY IR/ROXICODONE) 5 MG immediate release tablet, Take 1 tablet (5 mg total) by mouth every 8 (eight) hours as needed for severe pain., Disp: 45 tablet, Rfl: 0  Physical exam: There were no vitals filed for this visit. Limited due to telephone visit.  CMP Latest Ref Rng & Units 12/29/2018  Glucose 70 - 99 mg/dL 136(H)  BUN 8 - 23 mg/dL 11  Creatinine 0.61 - 1.24 mg/dL 0.68  Sodium 135 - 145 mmol/L 133(L)  Potassium 3.5 - 5.1 mmol/L 4.0  Chloride 98 - 111 mmol/L 98  CO2 22 - 32 mmol/L 25  Calcium 8.9 - 10.3 mg/dL 9.1  Total Protein 6.5 - 8.1 g/dL 7.2  Total Bilirubin 0.3 - 1.2 mg/dL 1.0  Alkaline Phos 38 - 126 U/L 374(H)  AST 15 - 41 U/L 73(H)  ALT 0 - 44 U/L 44   CBC Latest Ref Rng & Units 12/29/2018  WBC 4.0 - 10.5 K/uL 11.9(H)  Hemoglobin 13.0 - 17.0 g/dL 14.6  Hematocrit 39.0 - 52.0 % 44.4  Platelets 150 - 400 K/uL 498(H)    No images are attached to the encounter.  DG Chest 2 View  Result Date: 12/29/2018 CLINICAL DATA:  Chest pain EXAM: CHEST - 2 VIEW COMPARISON:  10/07/2018 FINDINGS: Normal heart size, mediastinal contours, and pulmonary vascularity. Bronchitic changes without pulmonary infiltrate, pleural effusion or pneumothorax. Question RIGHT nipple shadow. Several questionable nodular foci are seen in the lower LEFT lung up to 8 mm diameter. Minor degenerative disc disease changes thoracic spine. IMPRESSION: Bronchitic changes with questionable LEFT lung nodular foci and potential RIGHT nipple shadow; CT chest recommended to exclude pulmonary nodules. Electronically Signed   By: Lavonia Dana M.D.   On: 12/29/2018 16:30   CT Chest W Contrast  Result Date: 12/29/2018 CLINICAL DATA:  64 year old presenting with an approximate 30 pound weight loss over the past 2 months associated with generalized weakness, fatigue, LEFT-sided chest  pain and LEFT UPPER QUADRANT abdominal pain. Surgical history includes splenectomy, appendectomy and unspecified kidney surgery. EXAM: CT CHEST, ABDOMEN, AND PELVIS WITH CONTRAST TECHNIQUE: Multidetector CT imaging of the chest, abdomen and pelvis was performed following the standard protocol during bolus administration of intravenous contrast. CONTRAST:  13m OMNIPAQUE IOHEXOL 300 MG/ML IV. COMPARISON:  None. FINDINGS: CT CHEST FINDINGS Cardiovascular: Normal heart size. No visible coronary atherosclerosis. No pericardial effusion. Mild atherosclerosis involving the thoracic aorta. Proximal descending thoracic aortic aneurysm measuring approximately 3.1 cm diameter. No evidence of ascending thoracic aortic aneurysm. Visualized great vessels widely patent with mild atherosclerosis. Mediastinum/Nodes: Adjacent mildly enlarged enhancing subcarinal (station 7) lymph nodes which are partially necrotic, the larger measuring approximately 1.4 x 2.3 cm. Necrotic paracardiac lymph node INFERIOR to the heart measuring approximately 1.7 x 2.3 cm. Enlarged RIGHT paracardiac lymph nodes. Varices involving the distal esophagus. Esophagus otherwise normal in appearance. Approximate 1.1 cm nodule arising from the RIGHT lobe of the thyroid gland. Lungs/Pleura: Mild emphysematous changes in both lungs. Numerous BILATERAL lung nodules in a predominantly peripheral distribution, the largest measuring approximately 12 x 8 mm (mean 10 mm) in the posterosuperior RIGHT LOWER LOBE (series 4, image 73). No confluent or ground-glass airspace consolidation. No evidence of interstitial lung disease. No pleural effusions. Central airways patent without significant bronchial wall thickening. Musculoskeletal: Regional skeleton unremarkable without acute or significant osseous abnormality. No evidence of osseous metastatic disease. CT ABDOMEN PELVIS FINDINGS Hepatobiliary: Very large heterogeneously enhancing mass involving the RIGHT lobe of the  liver with approximate measurements of 12 x 12 x 10 cm. Satellite masses in the POSTERIOR segment RIGHT lobe measuring approximately 1.5 x 1.3 x 1.8 cm and in the LATERAL segment LEFT lobe measuring approximately 1.9 x 1.7 x 1.5 cm. Likely hepatic cirrhosis as there is a mildly irregular hepatic contour and there is relative enlargement of the LEFT lobe and caudate lobe. Gallbladder normal in appearance without calcified gallstones. No biliary ductal dilation. Pancreas: Normal in appearance without evidence of mass, ductal dilation, or inflammation. Spleen: Surgically absent. No visible residual splenic tissue. Adrenals/Urinary Tract: Normal appearing adrenal glands. Benign cortical cysts in both kidneys. No solid renal masses. No hydronephrosis. No urinary tract calculi. Normal appearing urinary bladder. Stomach/Bowel: Gastric varices. Stomach otherwise normal in appearance for degree of distension. Normal-appearing small bowel. Moderate stool burden in the cecum and ascending colon. Remainder of the colon relatively decompressed. No focal abnormalities involving the colon. Surgically absent appendix. Vascular/Lymphatic: Mild aortoiliofemoral atherosclerosis without evidence of aneurysm. Patent portal vein. Splenic vein surgically absent. Patent SUPERIOR mesenteric vein.  Visceral arteries patent. Lymphadenopathy in the porta hepatis and in the gastrohepatic ligament. No pathologic lymphadenopathy elsewhere in the abdomen or pelvis. Reproductive: Moderate prostate gland enlargement, particularly the median lobe. Normal seminal vesicles. Other: No ascites. Musculoskeletal: Transitional L5 segment. Mild degenerative disc disease at L3-4 with associated spondylosis. No acute findings. No evidence of osseous metastatic disease. IMPRESSION: 1. Very large heterogeneously enhancing mass involving the RIGHT lobe of the liver, measured above, likely indicating hepatocellular carcinoma. 2. Satellite masses in the POSTERIOR  segment RIGHT lobe and in the LATERAL segment LEFT lobe of the liver, measured above. 3. Metastatic lymphadenopathy in the porta hepatis and in the gastrohepatic ligament. 4. Numerous BILATERAL pulmonary metastases. 5. Metastatic mediastinal lymphadenopathy. 6. Likely hepatic cirrhosis. No evidence of ascites. 7. Gastroesophageal varices. 8. Moderate prostate gland enlargement, particularly the median lobe. 9. Proximal descending thoracic aortic aneurysm measuring 3.1 cm diameter. Aortic Atherosclerosis (ICD10-I70.0) and Emphysema (ICD10-J43.9). Electronically Signed   By: Evangeline Dakin M.D.   On: 12/29/2018 18:15   NM Bone Scan Whole Body  Result Date: 01/08/2019 CLINICAL DATA:  Liver mass.  Rib pain. EXAM: NUCLEAR MEDICINE WHOLE BODY BONE SCAN TECHNIQUE: Whole body anterior and posterior images were obtained approximately 3 hours after intravenous injection of radiopharmaceutical. RADIOPHARMACEUTICALS:  22.2 mCi Technetium-75mMDP IV COMPARISON:  CT 12/29/2018 FINDINGS: Within the posterior aspect of the LEFT iliac bone along the SI joint there is a discrete focus of radiotracer activity which is asymmetric and focal. No additional foci of abnormal radiotracer activity. Comparison CT scan there is small lucency at this location which is nonspecific (image 90/5). In particular no abnormal activity associated with the ribs. IMPRESSION: 1. Focal activity in the medial LEFT iliac bone. Depending on clinical concern this could be further evaluated with MRI of the pelvis. 2. Otherwise no evidence of malignancy. Electronically Signed   By: SSuzy BouchardM.D.   On: 01/08/2019 20:56   CT ABDOMEN PELVIS W CONTRAST  Result Date: 12/29/2018 CLINICAL DATA:  64year old presenting with an approximate 30 pound weight loss over the past 2 months associated with generalized weakness, fatigue, LEFT-sided chest pain and LEFT UPPER QUADRANT abdominal pain. Surgical history includes splenectomy, appendectomy and  unspecified kidney surgery. EXAM: CT CHEST, ABDOMEN, AND PELVIS WITH CONTRAST TECHNIQUE: Multidetector CT imaging of the chest, abdomen and pelvis was performed following the standard protocol during bolus administration of intravenous contrast. CONTRAST:  1071mOMNIPAQUE IOHEXOL 300 MG/ML IV. COMPARISON:  None. FINDINGS: CT CHEST FINDINGS Cardiovascular: Normal heart size. No visible coronary atherosclerosis. No pericardial effusion. Mild atherosclerosis involving the thoracic aorta. Proximal descending thoracic aortic aneurysm measuring approximately 3.1 cm diameter. No evidence of ascending thoracic aortic aneurysm. Visualized great vessels widely patent with mild atherosclerosis. Mediastinum/Nodes: Adjacent mildly enlarged enhancing subcarinal (station 7) lymph nodes which are partially necrotic, the larger measuring approximately 1.4 x 2.3 cm. Necrotic paracardiac lymph node INFERIOR to the heart measuring approximately 1.7 x 2.3 cm. Enlarged RIGHT paracardiac lymph nodes. Varices involving the distal esophagus. Esophagus otherwise normal in appearance. Approximate 1.1 cm nodule arising from the RIGHT lobe of the thyroid gland. Lungs/Pleura: Mild emphysematous changes in both lungs. Numerous BILATERAL lung nodules in a predominantly peripheral distribution, the largest measuring approximately 12 x 8 mm (mean 10 mm) in the posterosuperior RIGHT LOWER LOBE (series 4, image 73). No confluent or ground-glass airspace consolidation. No evidence of interstitial lung disease. No pleural effusions. Central airways patent without significant bronchial wall thickening. Musculoskeletal: Regional skeleton unremarkable without acute or significant osseous  abnormality. No evidence of osseous metastatic disease. CT ABDOMEN PELVIS FINDINGS Hepatobiliary: Very large heterogeneously enhancing mass involving the RIGHT lobe of the liver with approximate measurements of 12 x 12 x 10 cm. Satellite masses in the POSTERIOR segment  RIGHT lobe measuring approximately 1.5 x 1.3 x 1.8 cm and in the LATERAL segment LEFT lobe measuring approximately 1.9 x 1.7 x 1.5 cm. Likely hepatic cirrhosis as there is a mildly irregular hepatic contour and there is relative enlargement of the LEFT lobe and caudate lobe. Gallbladder normal in appearance without calcified gallstones. No biliary ductal dilation. Pancreas: Normal in appearance without evidence of mass, ductal dilation, or inflammation. Spleen: Surgically absent. No visible residual splenic tissue. Adrenals/Urinary Tract: Normal appearing adrenal glands. Benign cortical cysts in both kidneys. No solid renal masses. No hydronephrosis. No urinary tract calculi. Normal appearing urinary bladder. Stomach/Bowel: Gastric varices. Stomach otherwise normal in appearance for degree of distension. Normal-appearing small bowel. Moderate stool burden in the cecum and ascending colon. Remainder of the colon relatively decompressed. No focal abnormalities involving the colon. Surgically absent appendix. Vascular/Lymphatic: Mild aortoiliofemoral atherosclerosis without evidence of aneurysm. Patent portal vein. Splenic vein surgically absent. Patent SUPERIOR mesenteric vein. Visceral arteries patent. Lymphadenopathy in the porta hepatis and in the gastrohepatic ligament. No pathologic lymphadenopathy elsewhere in the abdomen or pelvis. Reproductive: Moderate prostate gland enlargement, particularly the median lobe. Normal seminal vesicles. Other: No ascites. Musculoskeletal: Transitional L5 segment. Mild degenerative disc disease at L3-4 with associated spondylosis. No acute findings. No evidence of osseous metastatic disease. IMPRESSION: 1. Very large heterogeneously enhancing mass involving the RIGHT lobe of the liver, measured above, likely indicating hepatocellular carcinoma. 2. Satellite masses in the POSTERIOR segment RIGHT lobe and in the LATERAL segment LEFT lobe of the liver, measured above. 3. Metastatic  lymphadenopathy in the porta hepatis and in the gastrohepatic ligament. 4. Numerous BILATERAL pulmonary metastases. 5. Metastatic mediastinal lymphadenopathy. 6. Likely hepatic cirrhosis. No evidence of ascites. 7. Gastroesophageal varices. 8. Moderate prostate gland enlargement, particularly the median lobe. 9. Proximal descending thoracic aortic aneurysm measuring 3.1 cm diameter. Aortic Atherosclerosis (ICD10-I70.0) and Emphysema (ICD10-J43.9). Electronically Signed   By: Evangeline Dakin M.D.   On: 12/29/2018 18:15   MR LIVER W WO CONTRAST  Result Date: 01/11/2019 CLINICAL DATA:  Weight loss of 30 pounds over the prior 2 months. Liver masses and suspected pulmonary metastases on recent CT. AF P 38,357. Biopsy planning. Additional history of sarcoidosis. EXAM: MRI ABDOMEN WITHOUT AND WITH CONTRAST TECHNIQUE: Multiplanar multisequence MR imaging of the abdomen was performed both before and after the administration of intravenous contrast. CONTRAST:  3m GADAVIST GADOBUTROL 1 MMOL/ML IV SOLN COMPARISON:  12/29/2018 CT chest, abdomen and pelvis. FINDINGS: Lower chest: Numerous subcentimeter pulmonary nodules scattered at both lung bases as seen on recent chest CT. Hepatobiliary: Diffusely finely irregular liver surface with relative hypertrophy of the lateral segment left liver lobe, compatible with hepatic cirrhosis. No hepatic steatosis. Dominant poorly marginated 12.3 x 11.4 cm superior liver mass predominantly involving segments 7, 8 and 4A (series 7/image 12) with heterogeneous T2 hyperintensity and heterogeneous avid enhancement. Numerous (greater than 10) additional avidly heterogeneously enhancing masses scattered throughout the liver. Representative 2.2 x 1.8 cm segment 2 left liver lobe mass (series 7/image 13) and 1.9 x 1.8 cm posterior right liver mass (series 7/image 14). Normal gallbladder with no cholelithiasis. Scattered regions of mild intrahepatic biliary ductal dilatation throughout the liver.  Common bile duct diameter 2 mm. No choledocholithiasis. Pancreas: No pancreatic mass or duct  dilation.  No pancreas divisum. Spleen: Splenectomy. Adrenals/Urinary Tract: Normal adrenals. No hydronephrosis. Scattered subcentimeter simple renal cysts in both kidneys. No suspicious renal masses. Stomach/Bowel: Normal non-distended stomach. Visualized small and large bowel is normal caliber, with no bowel wall thickening. Vascular/Lymphatic: Normal caliber abdominal aorta. Patent portal and renal veins. Narrowing of the intrahepatic portion of the IVC and of hepatic veins by the dominant liver mass. Moderate to large esophageal varices. Porta hepatis adenopathy up to 1.9 cm short axis diameter (series 13/image 41). Mildly enlarged 1.1 cm gastrohepatic ligament node (series 13/image 37). Mild right pericardiophrenic adenopathy up to 1.0 cm (series 13/image 15). Other: Trace perihepatic ascites.  No focal fluid collections. Musculoskeletal: Nonspecific small enhancing T2 hyperintense L1 and L2 vertebral lesions without correlate on recent bone scan, favoring small vertebral hemangiomas. No overtly suspicious focal osseous lesions. IMPRESSION: 1. Cirrhosis. Avidly enhancing dominant poorly marginated 12.3 cm superior liver mass, compatible with locally advanced hepatocellular carcinoma. 2. Numerous (greater than 10) additional avidly enhancing liver metastases scattered throughout the liver as detailed. 3. Porta hepatis and gastrohepatic ligament lymphadenopathy, suspicious for metastatic disease. 4. Numerous subcentimeter pulmonary nodules scattered at both lung bases as seen on recent chest CT, suspicious for pulmonary metastases. 5. Trace perihepatic ascites.  Moderate to large esophageal varices. 6. Scattered regions of mild intrahepatic biliary ductal dilatation throughout the liver due to mass-effect from liver masses. Normal caliber common bile duct (2 mm diameter). Electronically Signed   By: Ilona Sorrel M.D.    On: 01/11/2019 16:47     Assessment and plan- Patient is a 64 y.o. male who presents to [facility] for initial meeting in preparation for starting chemotherapy for the treatment of hepatocellular carcinoma.   1. HPI: Patient initially presented to the emergency department on 12/29/2018 for fatigue and weight loss. Work-up included imaging. CT chest abdomen pelvis revealed a large mass in liver with satellite lesions. Metastatic disease in lung concern for hepatocellular carcinoma. He was then evaluated by Dr. Mike Gip on 12/31/2018 where they discussed additional work-up including labs, possible liver biopsy and MRI of the liver. Labs revealed a markedly elevated AFP level. Liver MRI  revealed cirrhosis and a large liver mass with greater than 10 enhancing liver mass scattered throughout the liver. Noted pulmonary metastasis, trace ascites and moderate to large esophageal varices. Patient declined biopsy and would like to be treated without. Plan is to begin treatment with Avastin plus atezolizumab.   He was agreeable to be evaluated by GI Dr. Verl Blalock for esophageal varices.  Patient has history of sarcoidosis.  2. Chemo Care Clinic/High Risk for ER/Hospitalization during chemotherapy- We discussed the role of the chemo care clinic and identified patient specific risk factors. I discussed that patient was identified as high risk primarily based on: Stage of disease.  Patient has past medical history positive for: Past Medical History:  Diagnosis Date  . Sarcoidosis     Patient has past surgical history positive for: Past Surgical History:  Procedure Laterality Date  . APPENDECTOMY    . SPLENECTOMY      Based on our high risk symptom management report; this patient has a high risk of ED utilization.  The percentage below indicates how "at risk "  this patient based on the factors in this table within one year.   7% Low Risk Risk of Admission or ED Visit  This score indicates an  adult patient's 1-year risk, as a percentage, of a hospital admission or ED visit.  0%  7%   5 months ago 3 months ago 8 weeks ago Today     1% 07/23/18 2000   4% 10/07/18 1355   6% 12/29/18 1604   7% 12/30/18 1231   7% 01/20/19 1356    Factor Value  Current PCP Sarah Creed Copper, NP  Hospital Admissions 0  ED Visits 4   Has Medicaid No  Has Medicare No  In relationship Yes  Has Anemia No  Has asthma No  Has atrial fibrillation No  Has CVD No  Has chronic kidney disease No  Has Chronic Obstructive Pulmonary Disease No  Has Congestive Heart Failure No  Has Connective Tissue Disorder No  Has Depression No  Has Diabetes No  Has liver disease Yes  Has Peripheral Vascular Disease No     3. We discussed that social determinants of health may have significant impacts on health and outcomes for cancer patients.  Today we discussed specific social determinants of performance status, alcohol use, depression, financial needs, food insecurity, housing, interpersonal violence, social connections, stress, tobacco use, and transportation.    After lengthy discussion the following were identified as areas of need: None at this time.  We discussed options including home based and outpatient services, DME, and CARE program. We discusssed that patients who participate in regular physical activity report fewer negative impacts of cancer and treatments and report less fatigue.  We discussed self-referral to [facility] for counseling services, psychiatry for medication management, or palliative care/symptom management as well as primary care providers.  We discussed that living with cancer can create tremendous financial burden.  We discussed options for assistance. I asked that if assistance is needed in affording medications or paying bills to please let us know so that we can provide assistance.  We discussed options for food including social services.  We will also notify [facility] to see if  cancer center can provide support.  Patient informed of food pantry at cancer center and was provided with care package today.  Please notify nursing if un-met needs. We discussed referral to social work. Will also discuss with Elease Etienne to see if [facility] can provide support of utility bills, etc.  We discussed options for support groups at the cancer center. If interested, please notify nurse navigator to enroll.  We discussed options for managing stress including healthy eating, exercise as well as participating in no charge counseling services at the cancer center and support groups.  If these are of interest, patient can notify either myself or primary nursing team. We discussed options for management including medications and referral to quit Smart program We discussed options for transportation including acta, paratransit, bus routes, link transit, taxi/uber/lyft, and cancer center van.  I have notified primary oncology team who will help assist with arranging Lucianne Lei transportation for appointments when needed. We also discussed options for transportation on short notice/acute visits.   5.  Pain management/palliative medicine: Patient having significant back and abdominal pain.  Currently taking Tylenol and melatonin for sleep.  Spoke with [employer], palliative medicine NP and he suggested starting him on oxycodone 5 mg 3 times daily as needed for pain.  He asked that he follow-up with him in approximately 1 week for a virtual visit.  Patient agreeable.  We also discussed the role of the Symptom Management Clinic at [facility] for acute issues and methods of contacting clinic/provider. He denies needing specific assistance at this time and He will be followed by Jone Baseman,  RN.   Plan: Review [facility].  Review resources available at the cancer center. [employer] e-mail and telephone given.  Discussed pain management.Reviewed with Billey Chang, NP.  Rx Oxycodone 5 mg TID prn for pain.    Disposition: Keep scheduled appts.  RTC in 1 week for follow-up with Josh Borders-Appt made.  Visit Diagnosis 1. Hepatocellular carcinoma ([place])   2. Neoplasm related pain     Patient expressed understanding and was in agreement with this plan. He also understands that He can call clinic at any time with any questions, concerns, or complaints.   A total of (25) minutes of face-to-face time was spent with this patient with greater than 50% of that time in counseling and care-coordination.  [facility]  CC:

## 2019-01-21 NOTE — Progress Notes (Signed)
[facility]  [address], [address] [place], [place] [address] Phone: [phone]  Fax: [phone]   Clinic Day:  01/23/2019  Referring physician: Sallee Lange, *  Chief Complaint: Darin Lowery is a 64 y.o. male with metastatic hepatocellular carcinoma who is seen for assessment prior to cycle #1 atezolizumab + Avastin.   HPI: The patient was last seen in the medical oncology clinic on 01/14/2018.  At that time, he denied any new complaints. He remained fatigued. He had lost an additional pound. We discussed treatment options.  He was interested in [employer] + Avastin.  Child-Pugh score was 6 (class A).  Chemotherapy class was scheduled.  GI consult was scheduled prior to initiation of therapy.  Patient was prescribed ondansetron 8 mg po every 8 hours prn.   The patient was seen by Faythe Casa, NP on 01/20/2019 in the chemo care clinic. He was fatigued. He had weight loss. He had abdominal pain and back pain. She recommended the patient be seen by Altha Harm, NP.  He was prescribed oxycodone 5 mg TID prn for pain.   During the interim, he felt good after attending the chemo class. His weight is up 1 pound. His diarrhea has resolved but he reports constipation. He has not used his nausea medication (ondansetron) yet.  He has a chronic cough and shortness of breath.   He notes pain in his upper abdomen and ribs. He has chronic constant bilateral arm and shoulder pain. He is taking oxycodone 5 mg TID.  He notes the pain medication makes him sleepy; he sleeps better at night. He will see Altha Harm, NP on 01/27/2019. I advised the patient to start a pain diary.   The patient is scheduled to see Dr. Allen Norris on 02/07/2019.  He has not yet had a COVID-19 test.   Past Medical History:  Diagnosis Date  . Sarcoidosis     Past Surgical History:  Procedure Laterality Date  . APPENDECTOMY    . SPLENECTOMY      Family History  Problem Relation  Age of Onset  . Liver disease Father     Social History:  reports that he has been smoking cigarettes. He has never used smokeless tobacco. He reports current alcohol use of about 6.0 standard drinks of alcohol per week. He reports previous drug use. 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 He has been smoking for 40years. Patient smokes1.5 packs a day. Patientused todrink7-8 beers a day for>3 years. He owns a Data processing manager. His wife's name is [name]. The patient is alone today.  Allergies: No Known Allergies  Current Medications: Current Outpatient Medications  Medication Sig Dispense Refill  . oxyCODONE (OXY IR/ROXICODONE) 5 MG immediate release tablet Take 1 tablet (5 mg total) by mouth every 8 (eight) hours as needed for severe pain. 45 tablet 0  . ondansetron (ZOFRAN) 8 MG tablet Take 1 tablet (8 mg total) by mouth every 8 (eight) hours as needed for nausea or vomiting. (Patient not taking: Reported on 01/23/2019) 20 tablet 0   No current facility-administered medications for this visit.    Review of Systems  Constitutional: Negative for chills, diaphoresis, fever, malaise/fatigue and weight loss (up 1 pound).       Feels "good" after taking chemo class.  HENT: Negative.  Negative for congestion, ear pain, hearing loss, nosebleeds, sinus pain and sore throat.   Eyes: Negative.  Negative for blurred vision, double vision and photophobia.  Respiratory: Positive for cough and shortness of breath. Negative for hemoptysis and sputum production.   Cardiovascular: Negative.  Negative for chest pain, palpitations and leg swelling.  Gastrointestinal: Positive for abdominal pain (upper abdomen), constipation and nausea. Negative for blood in stool, diarrhea (on and off; resolved), melena and vomiting.       Colonoscopy in 2014.  Eating well.  Genitourinary: Negative.  Negative for dysuria, frequency (at night), hematuria and urgency.  Musculoskeletal: Positive  for joint pain (bilateral shoulders secondary to physical activity) and myalgias (bilateral arms secondary to physical activity). Negative for back pain and neck pain.       Whole body pain after physical activity. Rib pain exacerbated by coughing.  Skin: Negative.  Negative for itching and rash.  Neurological: Negative.  Negative for dizziness, tingling, sensory change, speech change, focal weakness, weakness and headaches.  Endo/Heme/Allergies: Does not bruise/bleed easily.       Sarcoidosis.  Psychiatric/Behavioral: Negative.  Negative for depression and memory loss. The patient is not nervous/anxious and does not have insomnia.   All other systems reviewed and are negative.  Performance status (ECOG): 1  Vitals Blood pressure 110/80, pulse 97, temperature 98.2 F (36.8 C), temperature source Oral, resp. rate 18, weight 142 lb 8.4 oz (64.7 kg), SpO2 97 %.   Physical Exam  Constitutional: He is oriented to person, place, and time. He appears well-developed and well-nourished. No distress.  HENT:  Head: Normocephalic and atraumatic.  Shaved head.   Mask.  Eyes: Conjunctivae and EOM are normal. No scleral icterus.  Glasses.  Hazel/brown eyes.  Neurological: He is alert and oriented to person, place, and time.  Skin: He is not diaphoretic.  Psychiatric: He has a normal mood and affect. His behavior is normal. Judgment and thought content normal.  Nursing note and vitals reviewed.   Appointment on 01/23/2019  Component Date Value Ref Range Status  . WBC 01/23/2019 12.9* 4.0 - 10.5 K/uL Final  . RBC 01/23/2019 5.09  4.22 - 5.81 MIL/uL Final  . Hemoglobin 01/23/2019 15.3  13.0 - 17.0 g/dL Final  . HCT 01/23/2019 46.4  39.0 - 52.0 % Final  . MCV 01/23/2019 91.2  80.0 - 100.0 fL Final  . MCH 01/23/2019 30.1  26.0 - 34.0 pg Final  . MCHC 01/23/2019 33.0  30.0 - 36.0 g/dL Final  . RDW 01/23/2019 13.8  11.5 - 15.5 % Final  . Platelets 01/23/2019 538* 150 - 400 K/uL Final  . nRBC  01/23/2019 0.0  0.0 - 0.2 % Final  . Neutrophils Relative % 01/23/2019 72  % Final  . Neutro Abs 01/23/2019 9.4* 1.7 - 7.7 K/uL Final  . Lymphocytes Relative 01/23/2019 10  % Final  . Lymphs Abs 01/23/2019 1.3  0.7 - 4.0 K/uL Final  . Monocytes Relative 01/23/2019 15  % Final  . Monocytes Absolute 01/23/2019 2.0* 0.1 - 1.0 K/uL Final  . Eosinophils Relative 01/23/2019 1  % Final  . Eosinophils Absolute 01/23/2019 0.1  0.0 - 0.5 K/uL Final  . Basophils Relative 01/23/2019 1  % Final  . Basophils Absolute 01/23/2019 0.1  0.0 - 0.1 K/uL Final  . Immature Granulocytes 01/23/2019 1  % Final  . Abs Immature Granulocytes 01/23/2019 0.07  0.00 - 0.07 K/uL Final   Performed at [facility], [address], [place], [place] [address]    Assessment:  Darris Bippus is a 64 y.o. male with probable metastatic hepatocellular carcinoma.  Chest, abdomen, and pelvisCTon 12/29/2018 revealed a12  x 12 x 10 cmheterogeneously enhancing mass involving the RIGHT lobe of the liver, likely indicating hepatocellular carcinoma.There were satellite massesin the POSTERIOR segment RIGHT lobe (1.5 x 1.3 x 1.8 cm) and in the LATERAL segment LEFT lobe of the liver(1.9 x 1.7 x 1.5 cm). There was metastatic lymphadenopathy in the porta hepatis and in the gastrohepatic ligament. There was numerous bilateral pulmonary metastases(largest 12 x 8 mm in the RLL)and metastatic mediastinal lymphadenopathy.There was a 1.4 x 2.3 cm partially necrotic subcarinal node and a 1.7 x 2.3 cm necrotic paracardiac node INFERIOR to the heart. 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 There was no ascites. There was moderate prostate gland enlargement, particularly the median lobe. There wasa 3.1 cmproximal descending thoracic aortic aneurysm.  Work up on 12/31/2018 revealed an AFP 38,357, CEA 5.7, PSA of 1.25, chromogranin A 59.4.  Hepatitis B core and surface antigen and hepatitis C were negative.  PT/INR was  12.9/1.0 and PTT was 31.  Bone scan on 01/08/2019 showed focal activity in the medial left iliac bone. Depending on clinical concern this could be further evaluated with MRI of the pelvis.Otherwise there was no evidence of malignancy.  Lver MRI on 01/11/2019 revealed cirrhosis.  There was an avidly enhancing dominant poorly marginated 12.3 cm superior liver mass, compatible with locally advanced hepatocellular carcinoma. There were numerous (> 10) additional avidly enhancing liver metastases scattered throughout the liver as detailed. There was porta hepatis and gastrohepatic ligament lymphadenopathy, suspicious for metastatic disease.  There were numerous subcentimeter pulmonary nodules scattered at both lung bases, suspicious for pulmonary metastases. There was trace perihepatic ascites. There was moderate to large esophageal varices. There were scattered regions of mild intrahepatic biliary ductal dilatation throughout the liver due to mass-effect from liver masses. There was a normal caliber common bile duct (2 mm diameter).  Child-Pugh scorewas 6 (class A) on 12/31/2018.  He has a history of sarcoids/p multiple biopsies (including liver) which revealed granulomas (no report available).He has a history of blood transfusion in the 1980s. He has a 60 pack year smoking history.  Symptomatically, he has had upper abdominal pain relived with oxycodone.  He has constipation.  Bilirubin is 2.4 (direct 1.3).  Plan: 1.   Labs today: CBC with diff, CMP, direct bilirubin, Mg, AFP. 2.   Metastatic hepatocellular carcinoma Patient has underlying cirrhosiswith varices and no ascites. He has a history of alcohol use. Lver MRI on 01/11/2019 c/w advanced hepatocellular carcinoma.                         AFP 38,357 (0-8.3).             Patient declined biopsy.             Disease is unresectable.  Treatment is palliative.  Patient decided to pursue  atezolizumab + Avastin.   Patient attended chemotherapy class.    Patient aware of potential side effects.    He has consented to treatment.                         Review importance of management of esophageal varices.                         GI consult placed at last visit.    Dr Allen Norris contacted today with plan for EGD next week. 3. Upper abdominal pain Etiology felt secondary to liver lesions.  Patient notes adequate  relief with current pain medication regimen.  Encourage keeping a pain diary for future adjustment of pain medications. 4.   Increasing liver function tests  Bilirubin 1.0.  Alkaline phosphatase 374.  AST 73.  ALT 44 on 12/29/2018   Bilirubin 2.4.  Alkaline phosphatase 494.  AST 115.  ALT 59 on 01/23/2019.   Direct bilirubin 1.3.  Etiology secondary to hepatocellular carcinoma.  RUQ ultrasound STAT to r/o ductal obstruction for potential stent placement. 5.   No chemotherapy today (he has not seen GI). 6.   RTC after upper endoscopy for MD assessment, labs (CBC with diff, CMP, Mg) and cycle #1 Avastin and Tecentriq.   Addendum:  RUQ ultrasound on 01/23/2019 revealed a 9 mm gallbladder polyp.  There was questionable gallbladder sludge.  There was no biliary distention.  There was confluent indistinct mass lesions noted throughout the liver (no gross change from 01/11/2019).  I discussed the assessment and treatment plan with the patient.  The patient was provided an opportunity to ask questions and all were answered.  The patient agreed with the plan and demonstrated an understanding of the instructions.  The patient was advised to call back if the symptoms worsen or if the condition fails to improve as anticipated.  I provided 18 minutes of face-to-face time during this this encounter and > 50% was spent counseling as documented under my assessment and plan. An additional 10 minutes was spent discussing the current findings with Dr Allen Norris and arranging  for early endoscopy.   Lequita Asal, MD, PhD    01/23/2019, 9:38 AM  I, Selena Batten, am acting as scribe for [employer]. Mike Gip, MD, PhD.  I, Melissa C. Mike Gip, MD, have reviewed the above documentation for accuracy and completeness, and I agree with the above.

## 2019-01-23 ENCOUNTER — Inpatient Hospital Stay: Payer: Commercial Managed Care - PPO

## 2019-01-23 ENCOUNTER — Inpatient Hospital Stay: Payer: Commercial Managed Care - PPO | Admitting: Hematology and Oncology

## 2019-01-23 ENCOUNTER — Other Ambulatory Visit: Payer: Self-pay

## 2019-01-23 ENCOUNTER — Ambulatory Visit
Admission: RE | Admit: 2019-01-23 | Discharge: 2019-01-23 | Disposition: A | Discharge status: Home / Self Care | Payer: Commercial Managed Care - PPO | Source: Ambulatory Visit | Attending: Hematology and Oncology | Admitting: Hematology and Oncology

## 2019-01-23 VITALS — BP 110/80 | HR 97 | Temp 98.2°F | Resp 18 | Wt 142.5 lb

## 2019-01-23 DIAGNOSIS — C220 Liver cell carcinoma: Secondary | ICD-10-CM

## 2019-01-23 DIAGNOSIS — R17 Unspecified jaundice: Secondary | ICD-10-CM

## 2019-01-23 DIAGNOSIS — K7460 Unspecified cirrhosis of liver: Secondary | ICD-10-CM

## 2019-01-23 DIAGNOSIS — Z7189 Other specified counseling: Secondary | ICD-10-CM

## 2019-01-23 DIAGNOSIS — G893 Neoplasm related pain (acute) (chronic): Secondary | ICD-10-CM | POA: Diagnosis not present

## 2019-01-23 LAB — CBC WITH DIFFERENTIAL/PLATELET
Abs Immature Granulocytes: 0.07 10*3/uL (ref 0.00–0.07)
Basophils Absolute: 0.1 10*3/uL (ref 0.0–0.1)
Basophils Relative: 1 %
Eosinophils Absolute: 0.1 10*3/uL (ref 0.0–0.5)
Eosinophils Relative: 1 %
HCT: 46.4 % (ref 39.0–52.0)
Hemoglobin: 15.3 g/dL (ref 13.0–17.0)
Immature Granulocytes: 1 %
Lymphocytes Relative: 10 %
Lymphs Abs: 1.3 10*3/uL (ref 0.7–4.0)
MCH: 30.1 pg (ref 26.0–34.0)
MCHC: 33 g/dL (ref 30.0–36.0)
MCV: 91.2 fL (ref 80.0–100.0)
Monocytes Absolute: 2 10*3/uL — ABNORMAL HIGH (ref 0.1–1.0)
Monocytes Relative: 15 %
Neutro Abs: 9.4 10*3/uL — ABNORMAL HIGH (ref 1.7–7.7)
Neutrophils Relative %: 72 %
Platelets: 538 10*3/uL — ABNORMAL HIGH (ref 150–400)
RBC: 5.09 MIL/uL (ref 4.22–5.81)
RDW: 13.8 % (ref 11.5–15.5)
WBC: 12.9 10*3/uL — ABNORMAL HIGH (ref 4.0–10.5)
nRBC: 0 % (ref 0.0–0.2)

## 2019-01-23 LAB — COMPREHENSIVE METABOLIC PANEL
ALT: 59 U/L — ABNORMAL HIGH (ref 0–44)
AST: 115 U/L — ABNORMAL HIGH (ref 15–41)
Albumin: 3 g/dL — ABNORMAL LOW (ref 3.5–5.0)
Alkaline Phosphatase: 494 U/L — ABNORMAL HIGH (ref 38–126)
Anion gap: 11 (ref 5–15)
BUN: 8 mg/dL (ref 8–23)
CO2: 27 mmol/L (ref 22–32)
Calcium: 9.3 mg/dL (ref 8.9–10.3)
Chloride: 91 mmol/L — ABNORMAL LOW (ref 98–111)
Creatinine, Ser: 0.63 mg/dL (ref 0.61–1.24)
GFR calc Af Amer: >60 mL/min (ref >60)
GFR calc non Af Amer: >60 mL/min (ref >60)
Glucose, Bld: 161 mg/dL — ABNORMAL HIGH (ref 70–99)
Potassium: 4.6 mmol/L (ref 3.5–5.1)
Sodium: 129 mmol/L — ABNORMAL LOW (ref 135–145)
Total Bilirubin: 2.4 mg/dL — ABNORMAL HIGH (ref 0.3–1.2)
Total Protein: 7.4 g/dL (ref 6.5–8.1)

## 2019-01-23 LAB — MAGNESIUM: Magnesium: 2.2 mg/dL (ref 1.7–2.4)

## 2019-01-23 LAB — BILIRUBIN, DIRECT: Bilirubin, Direct: 1.3 mg/dL — ABNORMAL HIGH (ref 0.0–0.2)

## 2019-01-23 NOTE — Progress Notes (Signed)
Patient here for follow up. Denies any concerns.  

## 2019-01-24 ENCOUNTER — Other Ambulatory Visit
Admission: RE | Admit: 2019-01-24 | Discharge: 2019-01-24 | Disposition: A | Discharge status: Home / Self Care | Payer: Commercial Managed Care - PPO | Source: Ambulatory Visit | Attending: Gastroenterology | Admitting: Gastroenterology

## 2019-01-24 ENCOUNTER — Other Ambulatory Visit: Payer: Self-pay

## 2019-01-24 DIAGNOSIS — Z01812 Encounter for preprocedural laboratory examination: Secondary | ICD-10-CM | POA: Diagnosis not present

## 2019-01-24 DIAGNOSIS — Z20822 Contact with and (suspected) exposure to covid-19: Secondary | ICD-10-CM | POA: Insufficient documentation

## 2019-01-24 DIAGNOSIS — G893 Neoplasm related pain (acute) (chronic): Secondary | ICD-10-CM | POA: Insufficient documentation

## 2019-01-24 LAB — SARS CORONAVIRUS 2 (TAT 6-24 HRS): SARS Coronavirus 2: NEGATIVE

## 2019-01-24 LAB — AFP TUMOR MARKER: AFP, Serum, Tumor Marker: 67573 ng/mL — ABNORMAL HIGH (ref 0.0–8.3)

## 2019-01-27 ENCOUNTER — Inpatient Hospital Stay (HOSPITAL_BASED_OUTPATIENT_CLINIC_OR_DEPARTMENT_OTHER): Payer: Commercial Managed Care - PPO | Admitting: Hospice and Palliative Medicine

## 2019-01-27 DIAGNOSIS — G893 Neoplasm related pain (acute) (chronic): Secondary | ICD-10-CM | POA: Diagnosis not present

## 2019-01-27 DIAGNOSIS — Z515 Encounter for palliative care: Secondary | ICD-10-CM

## 2019-01-27 DIAGNOSIS — C220 Liver cell carcinoma: Secondary | ICD-10-CM | POA: Diagnosis not present

## 2019-01-27 NOTE — Progress Notes (Signed)
Virtual Visit via Telephone Note  I connected with Darin Lowery on 01/27/19 at  9:30 AM EST by telephone and verified that I am speaking with the correct person using two identifiers.   I discussed the limitations, risks, security and privacy concerns of performing an evaluation and management service by telephone and the availability of in person appointments. I also discussed with the patient that there may be a patient responsible charge related to this service. The patient expressed understanding and agreed to proceed.   History of Present Illness: [name] is a 64 year old man with multiple medical problems including metastatic [place] who is yet to initiate treatment.  Patient also has underlying Child-Pugh class A cirrhosis with varices.  He has a history of EtOH use and is a current smoker but has reduced use to five or so cigarettes a day.  Patient has had persistent abdominal pain.  He was referred to palliative care to help address goals and manage ongoing symptoms.  Patient is married and lives at home with his wife and his mother.  He has four adult children.  He describes having good support from family.  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   Observations/Objective: I called and spoke with both patient and wife.  Patient says that his pain is markedly improved with 3 times daily dosing of oxycodone.  He says previously, pain was averaging 7-9 out of 10.  Pain is now 3-4 out of 10 after use of oxycodone.  There are times that he is pain-free.  Note the patient does also have chronic shoulder pain that has been present for several years.  However, primary source of pain is now abdominal likely secondary to his liver lesions.  Patient says that he is getting about 5 to 6 h of sleep at night and is waking to use the restroom.  He does feel fatigued in the morning but this resolves as the day progresses.  Patient also  endorses constipation.  He is currently taking twice daily MiraLAX and is having a bowel movement about twice a day.  Patient is planning to reduce frequency of MiraLAX to once daily.  We did discuss alternative stimulant laxatives should he need them.  Assessment and Plan: Stage IV HCC -pending treatment with atezolizumab + Avastin with palliative intent.   Neoplasm related pain -continue oxycodone 3 times daily.  Could increase frequency or try a long-acting opioid if needed.  However, pain seems currently reasonably well controlled on current dose/frequency.  Patient is keeping a pain diary.  Constipation -continue current bowel regimen with use of daily MiraLAX.  May add Senokot if needed.   Follow Up Instructions: Follow-up telephone visit in 2 to 3 weeks   I discussed the assessment and treatment plan with the patient. The patient was provided an opportunity to ask questions and all were answered. The patient agreed with the plan and demonstrated an understanding of the instructions.   The patient was advised to call back or seek an in-person evaluation if the symptoms worsen or if the condition fails to improve as anticipated.  I provided 30 minutes of non-face-to-face time during this encounter.   Irean Hong, NP

## 2019-01-28 ENCOUNTER — Encounter: Payer: Self-pay | Admitting: Hematology and Oncology

## 2019-01-28 ENCOUNTER — Other Ambulatory Visit: Payer: Self-pay

## 2019-01-28 ENCOUNTER — Ambulatory Visit: Payer: Commercial Managed Care - PPO | Admitting: Registered Nurse

## 2019-01-28 ENCOUNTER — Encounter
Admission: RE | Disposition: A | Discharge status: Home / Self Care | Payer: Self-pay | Source: Home / Self Care | Attending: Gastroenterology

## 2019-01-28 ENCOUNTER — Encounter: Payer: Self-pay | Admitting: Gastroenterology

## 2019-01-28 ENCOUNTER — Telehealth: Payer: Self-pay | Admitting: Hematology and Oncology

## 2019-01-28 ENCOUNTER — Ambulatory Visit
Admission: RE | Admit: 2019-01-28 | Discharge: 2019-01-28 | Disposition: A | Discharge status: Home / Self Care | Payer: Commercial Managed Care - PPO | Attending: Gastroenterology | Admitting: Gastroenterology

## 2019-01-28 DIAGNOSIS — Z8505 Personal history of malignant neoplasm of liver: Secondary | ICD-10-CM | POA: Diagnosis not present

## 2019-01-28 DIAGNOSIS — K269 Duodenal ulcer, unspecified as acute or chronic, without hemorrhage or perforation: Secondary | ICD-10-CM | POA: Diagnosis not present

## 2019-01-28 DIAGNOSIS — F17210 Nicotine dependence, cigarettes, uncomplicated: Secondary | ICD-10-CM | POA: Insufficient documentation

## 2019-01-28 DIAGNOSIS — I8510 Secondary esophageal varices without bleeding: Secondary | ICD-10-CM | POA: Diagnosis not present

## 2019-01-28 DIAGNOSIS — K7460 Unspecified cirrhosis of liver: Secondary | ICD-10-CM

## 2019-01-28 HISTORY — DX: Malignant (primary) neoplasm, unspecified: C80.1

## 2019-01-28 HISTORY — PX: ESOPHAGOGASTRODUODENOSCOPY (EGD) WITH PROPOFOL: SHX5813

## 2019-01-28 SURGERY — ESOPHAGOGASTRODUODENOSCOPY (EGD) WITH PROPOFOL
Anesthesia: General

## 2019-01-28 MED ORDER — NADOLOL 40 MG PO TABS: 40.0000 mg | ORAL_TABLET | Freq: Every day | ORAL | 11 refills | Status: AC

## 2019-01-28 MED ORDER — PROPOFOL 10 MG/ML IV BOLUS
INTRAVENOUS | Status: DC | PRN
  Administered 2019-01-28: 30 mg via INTRAVENOUS
  Administered 2019-01-28: 70 mg via INTRAVENOUS

## 2019-01-28 MED ORDER — SODIUM CHLORIDE 0.9 % IV SOLN
INTRAVENOUS | Status: DC
  Administered 2019-01-28: 1000 mL via INTRAVENOUS

## 2019-01-28 MED ORDER — PANTOPRAZOLE SODIUM 40 MG PO TBEC: 40.0000 mg | DELAYED_RELEASE_TABLET | Freq: Every day | ORAL | 6 refills | Status: AC

## 2019-01-28 NOTE — H&P (Signed)
Darin Lame, MD [facility] [address] [name], [place] [address] [phone] Fax : [phone]  Primary Care Physician:  Sallee Lange, NP Primary Gastroenterologist:  Dr. Allen Norris  Pre-Procedure History & Physical: HPI:  Darin Lowery is a 64 y.o. male is here for an endoscopy.   Past Medical History:  Diagnosis Date  . Cancer ([place])   . Sarcoidosis     Past Surgical History:  Procedure Laterality Date  . APPENDECTOMY      Prior to Admission medications   Medication Sig Start Date End Date Taking? Authorizing Provider  ondansetron (ZOFRAN) 8 MG tablet Take 1 tablet (8 mg total) by mouth every 8 (eight) hours as needed for nausea or vomiting. Patient not taking: Reported on 01/23/2019 01/15/19   Lequita Asal, MD  oxyCODONE (OXY IR/ROXICODONE) 5 MG immediate release tablet Take 1 tablet (5 mg total) by mouth every 8 (eight) hours as needed for severe pain. 01/20/19   Jacquelin Hawking, NP    Allergies as of 01/23/2019  . (No Known Allergies)    Family History  Problem Relation Age of Onset  . Liver disease Father     Social History   Socioeconomic History  . Marital status: Married    Spouse name: Not on file  . Number of children: Not on file  . Years of education: Not on file  . Highest education level: Not on file  Occupational History  . Not on file  Tobacco Use  . Smoking status: Current Every Day Smoker    Types: Cigarettes  . Smokeless tobacco: Never Used  Substance and Sexual Activity  . Alcohol use: Yes    Alcohol/week: 6.0 standard drinks    Types: 6 Cans of beer per week  . Drug use: Not Currently  . Sexual activity: Not on file  Other Topics Concern  . Not on file  Social History Narrative  . Not on file   Social Determinants of Health   Financial Resource Strain:   . Difficulty of Paying Living Expenses: Not on file  Food Insecurity:   . Worried About Charity fundraiser in the Last Year: Not on file  . Ran Out  of Food in the Last Year: Not on file  Transportation Needs:   . Lack of Transportation (Medical): Not on file  . Lack of Transportation (Non-Medical): Not on file  Physical Activity:   . Days of Exercise per Week: Not on file  . Minutes of Exercise per Session: Not on file  Stress:   . Feeling of Stress : Not on file  Social Connections:   . Frequency of Communication with Friends and Family: Not on file  . Frequency of Social Gatherings with Friends and Family: Not on file  . Attends Religious Services: Not on file  . Active Member of Clubs or Organizations: Not on file  . Attends Archivist Meetings: Not on file  . Marital Status: Not on file  Intimate Partner Violence:   . Fear of Current or Ex-Partner: Not on file  . Emotionally Abused: Not on file  . Physically Abused: Not on file  . Sexually Abused: Not on file    Review of Systems: See HPI, otherwise negative ROS  Physical Exam: BP (!) 104/93   Temp 98.4 F (36.9 C)   Resp 18   Ht 5\' 10"  (1.778 m)   Wt 64.2 kg Comment: 135 lbs  SpO2 100%   BMI 20.31 kg/m  General:  Alert,  pleasant and cooperative in NAD Head:  Normocephalic and atraumatic. Neck:  Supple; no masses or thyromegaly. Lungs:  Clear throughout to auscultation.    Heart:  Regular rate and rhythm. Abdomen:  Soft, nontender and nondistended. Normal bowel sounds, without guarding, and without rebound.   Neurologic:  Alert and  oriented x4;  grossly normal neurologically.  Impression/Plan: Darin Lowery is here for an endoscopy to be performed for evaluation for vaices  Risks, benefits, limitations, and alternatives regarding  endoscopy have been reviewed with the patient.  Questions have been answered.  All parties agreeable.   Darin Lame, MD  01/28/2019, 8:51 AM

## 2019-01-28 NOTE — Anesthesia Postprocedure Evaluation (Signed)
Anesthesia Post Note  Patient: Darin Lowery  Procedure(s) Performed: ESOPHAGOGASTRODUODENOSCOPY (EGD) WITH PROPOFOL (N/A )  Patient location during evaluation: PACU Anesthesia Type: General Level of consciousness: awake and alert Pain management: pain level controlled Vital Signs Assessment: post-procedure vital signs reviewed and stable Respiratory status: spontaneous breathing, nonlabored ventilation and respiratory function stable Cardiovascular status: blood pressure returned to baseline and stable Postop Assessment: no apparent nausea or vomiting Anesthetic complications: no     Last Vitals:  Vitals:   01/28/19 0820 01/28/19 0908  BP: (!) 104/93   Resp: 18   Temp: 36.9 C 36.8 C  SpO2: 100%     Last Pain:  Vitals:   01/28/19 0938  TempSrc:   PainSc: 0-No pain                 Tera Mater

## 2019-01-28 NOTE — Op Note (Signed)
[facility] Gastroenterology Patient Name: Darin Lowery Procedure Date: 01/28/2019 8:51 AM MRN: [id number] Account #: [id number] Date of Birth: 1955/04/13 Admit Type: Outpatient Age: 64 Room: [facility] ENDO ROOM 4 Gender: Male Note Status: Finalized Procedure:             Upper GI endoscopy Indications:           Cirrhosis rule out esophageal varices Providers:             Lucilla Lame MD, MD Medicines:             Propofol per Anesthesia Complications:         No immediate complications. Procedure:             Pre-Anesthesia Assessment:                        - Prior to the procedure, a History and Physical was                         performed, and patient medications and allergies were                         reviewed. The patient's tolerance of previous                         anesthesia was also reviewed. The risks and benefits                         of the procedure and the sedation options and risks                         were discussed with the patient. All questions were                         answered, and informed consent was obtained. Prior                         Anticoagulants: The patient has taken no previous                         anticoagulant or antiplatelet agents. ASA Grade                         Assessment: III - A patient with severe systemic                         disease. After reviewing the risks and benefits, the                         patient was deemed in satisfactory condition to                         undergo the procedure.                        After obtaining informed consent, the endoscope was                         passed under direct vision. Throughout the procedure,  the patient's blood pressure, pulse, and oxygen                         saturations were monitored continuously. The Endoscope                         was introduced through the mouth, and advanced to the                         second  part of duodenum. The upper GI endoscopy was                         accomplished without difficulty. The patient tolerated                         the procedure well. Findings:      Grade I varices with no stigmata of recent bleeding were found in the       lower third of the esophagus,. No red wale signs were present.      The stomach was normal.      A single localized erosion without bleeding was found in the duodenal       bulb. Impression:            - Grade I esophageal varices with no stigmata of                         recent bleeding.                        - Normal stomach.                        - Duodenal erosion without bleeding.                        - No specimens collected. Recommendation:        - Discharge patient to home.                        - Resume previous diet.                        - Continue present medications.                        - Primary prophylaxis for varices to be started.                        - Use a proton pump inhibitor PO daily. Procedure Code(s):     --- Professional ---                        [phone], Esophagogastroduodenoscopy, flexible,                         transoral; diagnostic, including collection of                         specimen(s) by brushing or washing, when performed                         (  separate procedure) Diagnosis Code(s):     --- Professional ---                        K74.60, Unspecified cirrhosis of liver                        K26.9, Duodenal ulcer, unspecified as acute or                         chronic, without hemorrhage or perforation CPT copyright 2019 American Medical Association. All rights reserved. The codes documented in this report are preliminary and upon coder review may  be revised to meet current compliance requirements. Lucilla Lame MD, MD 01/28/2019 9:09:49 AM This report has been signed electronically. Number of Addenda: 0 Note Initiated On: 01/28/2019 8:51 AM Estimated Blood Loss:  Estimated  blood loss: none. Estimated blood loss: none.      [facility]

## 2019-01-28 NOTE — Telephone Encounter (Signed)
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

## 2019-01-28 NOTE — Anesthesia Preprocedure Evaluation (Addendum)
Anesthesia Evaluation  Patient identified by MRN, date of birth, ID band Patient awake    Reviewed: Allergy & Precautions, H&P , NPO status , Patient's Chart, lab work & pertinent test results  Airway Mallampati: II  TM Distance: >3 FB Neck ROM: full    Dental  (+) Chipped   Pulmonary shortness of breath and with exertion, neg COPD, Current Smoker,           Cardiovascular (-) angina(-) Past MI negative cardio ROS  (-) dysrhythmias      Neuro/Psych negative neurological ROS  negative psych ROS   GI/Hepatic negative GI ROS, (+) Cirrhosis       , Cirrhosis and hepatocellular carcinoma No ascites   Endo/Other  negative endocrine ROS  Renal/GU negative Renal ROS  negative genitourinary   Musculoskeletal   Abdominal   Peds  Hematology negative hematology ROS (+)   Anesthesia Other Findings Past Medical History: No date: Cancer ([place]) No date: Sarcoidosis  Past Surgical History: No date: APPENDECTOMY  BMI    Body Mass Index: 20.31 kg/m      Reproductive/Obstetrics negative OB ROS                           Anesthesia Physical Anesthesia Plan  ASA: III  Anesthesia Plan: General   Post-op Pain Management:    Induction:   PONV Risk Score and Plan: Propofol infusion and TIVA  Airway Management Planned: Nasal Cannula and Natural Airway  Additional Equipment:   Intra-op Plan:   Post-operative Plan:   Informed Consent: I have reviewed the patients History and Physical, chart, labs and discussed the procedure including the risks, benefits and alternatives for the proposed anesthesia with the patient or authorized representative who has indicated his/her understanding and acceptance.     Dental Advisory Given  Plan Discussed with: Anesthesiologist and CRNA  Anesthesia Plan Comments:         Anesthesia Quick Evaluation

## 2019-01-28 NOTE — Transfer of Care (Signed)
Immediate Anesthesia Transfer of Care Note  Patient: Darin Lowery  Procedure(s) Performed: ESOPHAGOGASTRODUODENOSCOPY (EGD) WITH PROPOFOL (N/A )  Patient Location: PACU  Anesthesia Type:General  Level of Consciousness: sedated  Airway & Oxygen Therapy: Patient Spontanous Breathing  Post-op Assessment: Report given to RN and Post -op Vital signs reviewed and stable  Post vital signs: Reviewed and stable  Last Vitals:  Vitals Value Taken Time  BP    Temp    Pulse    Resp    SpO2      Last Pain:  Vitals:   01/28/19 0820  PainSc: 5       Patients Stated Pain Goal: 0 ([id number])  Complications: No apparent anesthesia complications

## 2019-01-28 NOTE — Telephone Encounter (Signed)
His wife called and said he had his endoscopy and could we set him up for chemo?

## 2019-01-29 ENCOUNTER — Encounter: Payer: Self-pay | Admitting: *Deleted

## 2019-01-30 ENCOUNTER — Encounter: Payer: Self-pay | Admitting: Hematology and Oncology

## 2019-01-30 ENCOUNTER — Other Ambulatory Visit: Payer: Self-pay | Admitting: *Deleted

## 2019-01-30 DIAGNOSIS — C220 Liver cell carcinoma: Secondary | ICD-10-CM

## 2019-01-30 NOTE — Progress Notes (Signed)
[facility]  [address], [address] [place],  [address] Phone: [phone]  Fax: [phone]   Clinic Day:  01/31/2019  Referring physician: Sallee Lowery, *  Chief Complaint: Darin Lowery is a 64 y.o. male with metastatic hepatocellular carcinoma who is seen for MD assessment prior cycle #1 Avastin and Tecentriq.  HPI: The patient was last seen in the medical oncology clinic on 01/23/2019. At that time, he had upper abdominal pain relived with oxycodone.  He had constipation.  Bilirubin was 2.4 (direct 1.3) AFP was 67,573.  Treatment was postponed until his GI evaluation for esophageal varices.  RUQ ultrasound on 01/23/2019 revealed a 9 mm gallbladder polyp.  There was questionable gallbladder sludge.  There was no biliary distention.  There was confluent indistinct mass lesions noted throughout the liver (no gross change from 01/11/2019).  Upper endoscopy on 01/28/2019 by Dr. Allen Norris revealed grade I esophageal varices with no stigmata of recent bleeding.  There was a normal stomach and duodenal erosion without bleeding. No specimens were collected.  Primary prophylaxis for the varices was to be started.  A proton pump inhibitor was to be taken daily.  A prescription for nadolol and Protonix were written.  During the interim, he has felt very fatigued and "worn out".  He feels terrible.  He has pain associated with his liver masses.  Pain levels are high.  He is only taking oxycodone 5 mg po every 8 hours.  Pain levels initiailly went down from a 7 to 0 then 7 to a 2.  Pain levels however have increased in the past week.  His wife states that he has a high pain tolerance.  He is working on a pain diary.  He has 10 oxycodone left.  He notes issues with sleeping at night because of the pain.   Appetite has decreased.  He will eat 3 small meals a day.  He is  Trying to drink Ensure/Boost.  He denies any nausea or vomiting.  He denies any melena or  hematochezia.  He has intermittent palpitations.  He started his nadolol and Protonix last night.  He notes orange colored urine color and green stools.  His eyes have become slightly yellow.  His wife notes that he his becoming confused. He will get confused about medication times and when he is supposed to be at appointments; he is getting frustrated easily.   His wife notes that many of the symptoms have come on with a vengeance.  Before coming to clinic, he and his wife discussed Hospice.  Regarding his activities, he spends the day in the recliner.  He will do his laundry and cooking. He has had no falls.    Past Medical History:  Diagnosis Date  . Cancer ([place])   . Sarcoidosis     Past Surgical History:  Procedure Laterality Date  . APPENDECTOMY    . ESOPHAGOGASTRODUODENOSCOPY (EGD) WITH PROPOFOL N/A 01/28/2019   Procedure: ESOPHAGOGASTRODUODENOSCOPY (EGD) WITH PROPOFOL;  Surgeon: Lucilla Lame, MD;  Location[facility] ENDOSCOPY;  Service: Endoscopy;  Laterality: N/A;    Family History  Problem Relation Age of Onset  . Liver disease Father     Social History:  reports that he has been smoking cigarettes. He has never used smokeless tobacco. He reports current alcohol use of about 6.0 standard drinks of alcohol per week. He reports previous drug use. 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 He has been smoking for  40years. Patient smokes1.5 packs a day. Patientused todrink7-8 beers a day for>3 years.He owns a Data processing manager.His wife's name is Darin Lowery The patient is accompanied by his wife viia video today.  Allergies: No Known Allergies  Current Medications: Current Outpatient Medications  Medication Sig Dispense Refill  . nadolol (CORGARD) 40 MG tablet Take 1 tablet (40 mg total) by mouth daily. 30 tablet 11  . oxyCODONE (OXY IR/ROXICODONE) 5 MG immediate release tablet Take 1 tablet (5 mg total) by mouth every 8 (eight) hours as needed for severe  pain. 45 tablet 0  . polyethylene glycol powder (GLYCOLAX/MIRALAX) 17 GM/SCOOP powder Take 1 Container by mouth once.    . ondansetron (ZOFRAN) 8 MG tablet Take 1 tablet (8 mg total) by mouth every 8 (eight) hours as needed for nausea or vomiting. (Patient not taking: Reported on 01/23/2019) 20 tablet 0  . pantoprazole (PROTONIX) 40 MG tablet Take 1 tablet (40 mg total) by mouth daily. (Patient not taking: Reported on 01/30/2019) 30 tablet 6   No current facility-administered medications for this visit.    Review of Systems  Constitutional: Positive for malaise/fatigue and weight loss (4 pounds). Negative for chills, diaphoresis and fever.       Feels "terrible".  Very fatigued.  HENT: Negative.  Negative for congestion, ear pain, hearing loss, nosebleeds, sinus pain and sore throat.   Eyes: Negative.  Negative for blurred vision, double vision and photophobia.  Respiratory: Positive for cough and shortness of breath. Negative for hemoptysis and sputum production.   Cardiovascular: Negative.  Negative for chest pain, palpitations, orthopnea and leg swelling.  Gastrointestinal: Positive for abdominal pain (RUQ), constipation (using Miralax) and diarrhea (green liquid stool). Negative for blood in stool, melena, nausea and vomiting.       Poor appetite..  Genitourinary: Negative.  Negative for dysuria, frequency (at night), hematuria and urgency.       Urine orange in color.  Musculoskeletal: Negative.  Negative for back pain, falls, joint pain, myalgias and neck pain.  Skin: Negative.  Negative for itching and rash.  Neurological: Negative.  Negative for dizziness, tingling, sensory change, speech change, focal weakness, weakness and headaches.  Endo/Heme/Allergies: Does not bruise/bleed easily.       Sarcoidosis.  Psychiatric/Behavioral: Negative for depression, hallucinations and memory loss. The patient has insomnia (pain related).        Confusion at times.  Frustration.  All other systems  reviewed and are negative.  Performance status (ECOG): 2  Vitals Blood pressure 98/74, pulse (!) 126, temperature (!) 97 F (36.1 C), temperature source Tympanic, resp. rate 18, height 5\' 10"  (1.778 m), weight 138 lb 14.9 oz (63 kg), SpO2 95 %.   Physical Exam  Constitutional: He is oriented to person, place, and time.  Fatigued appearing gentleman sitting in a wheelchair in no acute distress.  HENT:  Head: Normocephalic and atraumatic.  Mouth/Throat: Oropharynx is clear and moist. No oropharyngeal exudate.  Shaved head.   Mask.  Eyes: Pupils are equal, round, and reactive to light. EOM are normal. Scleral icterus is present.  Glasses.  Hazel/brown eyes.  Neck: No JVD present.  Cardiovascular: Regular rhythm and normal heart sounds. Tachycardia present. Exam reveals no gallop.  No murmur heard. Pulmonary/Chest: Effort normal and breath sounds normal. No respiratory distress. He has no wheezes. He has no rales.  Abdominal: Soft. Bowel sounds are normal. He exhibits no distension and no mass. There is hepatomegaly (4 FB down and to midline). There is abdominal tenderness in the  right upper quadrant. There is no rebound and no guarding.  No ascites.  Musculoskeletal:        General: No edema. Normal range of motion.     Cervical back: Normal range of motion and neck supple.  Lymphadenopathy:       Head (right side): No preauricular, no posterior auricular and no occipital adenopathy present.       Head (left side): No preauricular, no posterior auricular and no occipital adenopathy present.    He has no cervical adenopathy.    He has no axillary adenopathy.       Right: No inguinal and no supraclavicular adenopathy present.       Left: No inguinal and no supraclavicular adenopathy present.  Neurological: He is alert and oriented to person, place, and time.  Knows date, place, and time.  Difficulty counting backwards by 3 from 100.  Remembers 2 of 3 words after 10 minutes with  prompting.  Skin: Skin is warm and dry. No rash noted. He is not diaphoretic. No erythema. No pallor.  Psychiatric: He has a normal mood and affect. His behavior is normal. Judgment and thought content normal.  Nursing note and vitals reviewed.   Appointment on 01/31/2019  Component Date Value Ref Range Status  . WBC 01/31/2019 14.8* 4.0 - 10.5 K/uL Final  . RBC 01/31/2019 5.08  4.22 - 5.81 MIL/uL Final  . Hemoglobin 01/31/2019 15.5  13.0 - 17.0 g/dL Final  . HCT 01/31/2019 44.9  39.0 - 52.0 % Final  . MCV 01/31/2019 88.4  80.0 - 100.0 fL Final  . MCH 01/31/2019 30.5  26.0 - 34.0 pg Final  . MCHC 01/31/2019 34.5  30.0 - 36.0 g/dL Final  . RDW 01/31/2019 14.6  11.5 - 15.5 % Final  . Platelets 01/31/2019 619* 150 - 400 K/uL Final  . nRBC 01/31/2019 0.0  0.0 - 0.2 % Final  . Neutrophils Relative % 01/31/2019 78  % Final  . Neutro Abs 01/31/2019 11.6* 1.7 - 7.7 K/uL Final  . Lymphocytes Relative 01/31/2019 7  % Final  . Lymphs Abs 01/31/2019 1.0  0.7 - 4.0 K/uL Final  . Monocytes Relative 01/31/2019 14  % Final  . Monocytes Absolute 01/31/2019 2.0* 0.1 - 1.0 K/uL Final  . Eosinophils Relative 01/31/2019 0  % Final  . Eosinophils Absolute 01/31/2019 0.1  0.0 - 0.5 K/uL Final  . Basophils Relative 01/31/2019 1  % Final  . Basophils Absolute 01/31/2019 0.1  0.0 - 0.1 K/uL Final  . Immature Granulocytes 01/31/2019 0  % Final  . Abs Immature Granulocytes 01/31/2019 0.06  0.00 - 0.07 K/uL Final   Performed at [facility], [address], [place], [place] [address]  . Sodium 01/31/2019 130* 135 - 145 mmol/L Final  . Potassium 01/31/2019 4.5  3.5 - 5.1 mmol/L Final  . Chloride 01/31/2019 93* 98 - 111 mmol/L Final  . CO2 01/31/2019 27  22 - 32 mmol/L Final  . Glucose, Bld 01/31/2019 162* 70 - 99 mg/dL Final  . BUN 01/31/2019 11  8 - 23 mg/dL Final  . Creatinine, Ser 01/31/2019 0.61  0.61 - 1.24 mg/dL Final  . Calcium 01/31/2019 9.3  8.9 - 10.3 mg/dL Final  . Total Protein  01/31/2019 6.8  6.5 - 8.1 g/dL Final  . Albumin 01/31/2019 2.6* 3.5 - 5.0 g/dL Final  . AST 01/31/2019 116* 15 - 41 U/L Final  . ALT 01/31/2019 49* 0 - 44 U/L Final  . Alkaline  Phosphatase 01/31/2019 512* 38 - 126 U/L Final  . Total Bilirubin 01/31/2019 3.8* 0.3 - 1.2 mg/dL Final  . GFR calc non Af Amer 01/31/2019 >60  >60 mL/min Final  . GFR calc Af Amer 01/31/2019 >60  >60 mL/min Final  . Anion gap 01/31/2019 10  5 - 15 Final   Performed at [facility], [address] [place], [place] [address]  . Magnesium 01/31/2019 2.2  1.7 - 2.4 mg/dL Final   Performed at [facility], [address], [place], [place] [address]    Assessment:  Bulmaro Talbott is a 64 y.o. male with probable metastatic hepatocellular carcinoma.  Chest, abdomen, and pelvisCTon 12/29/2018 revealed a12 x 12 x 10 cmheterogeneously enhancing mass involving the RIGHT lobe of the liver, likely indicating hepatocellular carcinoma.There were satellite massesin the POSTERIOR segment RIGHT lobe (1.5 x 1.3 x 1.8 cm) and in the LATERAL segment LEFT lobe of the liver(1.9 x 1.7 x 1.5 cm). There was metastatic lymphadenopathy in the porta hepatis and in the gastrohepatic ligament. There was numerous bilateral pulmonary metastases(largest 12 x 8 mm in the RLL)and metastatic mediastinal lymphadenopathy.There was a 1.4 x 2.3 cm partially necrotic subcarinal node and a 1.7 x 2.3 cm necrotic paracardiac node INFERIOR to the heart. 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 There was no ascites. There was moderate prostate gland enlargement, particularly the median lobe. There wasa 3.1 cmproximal descending thoracic aortic aneurysm.  Work upon 12/29/2020revealedanAFP38,357, CEA 5.7,PSA of 1.25, chromogranin A 59.4.HepatitisB core and surface antigen and hepatitis C werenegative. PT/INRwas12.9/1.0and PTTwas 31.  Bone scanon 01/08/2019 showed focal activity in the  medialleftiliac bone. Depending on clinical concern this could be further evaluated with MRI of the pelvis.Otherwise there wasno evidence of malignancy.  LverMRIon 01/09/2021revealed cirrhosis.There was an avidly enhancing dominant poorly marginated 12.3 cm superior liver mass, compatible with locally advanced hepatocellular carcinoma.There were numerous (>10) additional avidly enhancing liver metastases scattered throughout the liver as detailed. There was porta hepatis and gastrohepatic ligament lymphadenopathy, suspicious for metastatic disease.There were numerous subcentimeter pulmonary nodules scattered at both lung bases, suspicious for pulmonary metastases. There was trace perihepatic ascites.There was moderate to large esophageal varices.There were scattered regions of mild intrahepatic biliary ductal dilatation throughout the liver due to mass-effect from liver masses.There was a normal caliber common bile duct (2 mm diameter).  AFP has been followed:  38,357 on 12/31/2018 and 67,573 on 01/23/2019.  Child-Pugh scorewas 6 (class A) on 12/31/2018 and 7 (class B) on 01/23/2019 and 10 (class C) on 01/31/2019.Marland Kitchen  Upper endoscopy on 01/28/2019 revealed grade I esophageal varices with no stigmata of recent bleeding.  There was a normal stomach and duodenal erosion without bleeding. He was started on nadolol and Protonix on 01/28/2019.  He has a history of sarcoids/p multiple biopsies (including liver) which revealed granulomas (no report available).He has a history of blood transfusion in the 1980s. He has a 60 pack year smoking history.  Symptomatically, he is profoundly fatigued.  He is spending the majority of the day in a recliner.  Appetite and weight are down.  He has had some confusion.  He has increased pain.  Bilirubin is 3.8  Plan: 1.   Labs today: CBC with diff, CMP, Mg, TSH, urine protein. 2.   Metastatic hepatocellular carcinoma Patient has  underlying cirrhosiswith varices and no ascites. He has a history of alcohol use. Lver MRI on 01/11/2019 c/w advanced hepatocellular carcinoma. AFP is rapidly rising. Patient declined biopsy. Disease is unresectable.Treatment is  palliative. Initial plan to pursue atezolizumab + Avastin.                         Discuss thoughts about therapy given rapidly declining status.   Treatment initially approved for Pugh Class A.   Decreased life expectancy with Pugh Class C.  After some discussion decision was made to pursue Hospice.   Patient and wife had been discussing.   Support provided. 3. Cancer related pain Etiology secondary to hepatocellular carcinoma.             Pain relief has been poor over past few days.  Rx:  oxycodone 5 mg po q 4 hours prn pain (dis: #45).   Increase in frequency from every 8 hours to every 4 hours.  Continue pain diary.  Anticipate initiation of long actin pain medication. 4.   Increasing liver function tests             Bilirubin 1.0.  Alkaline phosphatase 374.  AST 73.  ALT 44 on 12/29/2018              Bilirubin 2.4.  Alkaline phosphatase 494.  AST 115.  ALT 59 on 01/23/2019.  Bilirubin 3.8.  Alkaline phosphatase 512.  AST 116.  ALT 49 on 01/31/2019.             Suspect increasing ammonia level with confusion.  Etiology secondary to hepatocellular carcinoma.             RUQ ultrasound on 01/23/2019 revealed no stent feasibility. 5.   Constipation  Discuss management of constipation with increased pain medications. 6.   Code status  Discussed code status.  Patient still deciding about CPR, chest compressions and intubation.  Medical power of attorney paperwork copied.  MOST form provided. 7.   No treatment today. 8.   Hospice consult today. 9.   RTC prn  I discussed the assessment and treatment plan with the patient.  The patient was provided an  opportunity to ask questions and all were answered.  The patient agreed with the plan and demonstrated an understanding of the instructions.  The patient was advised to call back if the symptoms worsen or if the condition fails to improve as anticipated.  I provided 32 minutes (9:28 AM - 9:59 AM) of face-to-face time during this this encounter and > 50% was spent counseling as documented under my assessment and plan.     Lequita Asal, MD, PhD    01/31/2019, 9:59 AM  I, Samul Dada, am acting as a scribe for Lequita Asal, MD.  I, [place] Mike Gip, MD, have reviewed the above documentation for accuracy and completeness, and I agree with the above.

## 2019-01-30 NOTE — Progress Notes (Signed)
Pt called pt and verify DOB and name, reminded pt of appt in clinic tomorrow. Pt verbalized understanding.  Pt reports having some issues with constipation and occasionally gets choked  And coughs while and after eating.  Pt states is having some pain and takes oxycodone as directed.

## 2019-01-31 ENCOUNTER — Other Ambulatory Visit: Payer: Self-pay | Admitting: Hematology and Oncology

## 2019-01-31 ENCOUNTER — Ambulatory Visit: Payer: Commercial Managed Care - PPO

## 2019-01-31 ENCOUNTER — Inpatient Hospital Stay (HOSPITAL_BASED_OUTPATIENT_CLINIC_OR_DEPARTMENT_OTHER): Payer: Commercial Managed Care - PPO | Admitting: Hematology and Oncology

## 2019-01-31 ENCOUNTER — Other Ambulatory Visit: Payer: Self-pay

## 2019-01-31 ENCOUNTER — Telehealth: Payer: Self-pay

## 2019-01-31 ENCOUNTER — Inpatient Hospital Stay: Payer: Commercial Managed Care - PPO

## 2019-01-31 ENCOUNTER — Encounter: Payer: Self-pay | Admitting: Hematology and Oncology

## 2019-01-31 ENCOUNTER — Telehealth: Payer: Self-pay | Admitting: *Deleted

## 2019-01-31 VITALS — BP 98/74 | HR 126 | Temp 97.0°F | Resp 18 | Ht 70.0 in | Wt 138.9 lb

## 2019-01-31 DIAGNOSIS — C220 Liver cell carcinoma: Secondary | ICD-10-CM | POA: Diagnosis not present

## 2019-01-31 DIAGNOSIS — G893 Neoplasm related pain (acute) (chronic): Secondary | ICD-10-CM

## 2019-01-31 DIAGNOSIS — Z7189 Other specified counseling: Secondary | ICD-10-CM | POA: Diagnosis not present

## 2019-01-31 DIAGNOSIS — Z5112 Encounter for antineoplastic immunotherapy: Secondary | ICD-10-CM | POA: Insufficient documentation

## 2019-01-31 LAB — COMPREHENSIVE METABOLIC PANEL
ALT: 49 U/L — ABNORMAL HIGH (ref 0–44)
AST: 116 U/L — ABNORMAL HIGH (ref 15–41)
Albumin: 2.6 g/dL — ABNORMAL LOW (ref 3.5–5.0)
Alkaline Phosphatase: 512 U/L — ABNORMAL HIGH (ref 38–126)
Anion gap: 10 (ref 5–15)
BUN: 11 mg/dL (ref 8–23)
CO2: 27 mmol/L (ref 22–32)
Calcium: 9.3 mg/dL (ref 8.9–10.3)
Chloride: 93 mmol/L — ABNORMAL LOW (ref 98–111)
Creatinine, Ser: 0.61 mg/dL (ref 0.61–1.24)
GFR calc Af Amer: >60 mL/min (ref >60)
GFR calc non Af Amer: >60 mL/min (ref >60)
Glucose, Bld: 162 mg/dL — ABNORMAL HIGH (ref 70–99)
Potassium: 4.5 mmol/L (ref 3.5–5.1)
Sodium: 130 mmol/L — ABNORMAL LOW (ref 135–145)
Total Bilirubin: 3.8 mg/dL — ABNORMAL HIGH (ref 0.3–1.2)
Total Protein: 6.8 g/dL (ref 6.5–8.1)

## 2019-01-31 LAB — CBC WITH DIFFERENTIAL/PLATELET
Abs Immature Granulocytes: 0.06 10*3/uL (ref 0.00–0.07)
Basophils Absolute: 0.1 10*3/uL (ref 0.0–0.1)
Basophils Relative: 1 %
Eosinophils Absolute: 0.1 10*3/uL (ref 0.0–0.5)
Eosinophils Relative: 0 %
HCT: 44.9 % (ref 39.0–52.0)
Hemoglobin: 15.5 g/dL (ref 13.0–17.0)
Immature Granulocytes: 0 %
Lymphocytes Relative: 7 %
Lymphs Abs: 1 10*3/uL (ref 0.7–4.0)
MCH: 30.5 pg (ref 26.0–34.0)
MCHC: 34.5 g/dL (ref 30.0–36.0)
MCV: 88.4 fL (ref 80.0–100.0)
Monocytes Absolute: 2 10*3/uL — ABNORMAL HIGH (ref 0.1–1.0)
Monocytes Relative: 14 %
Neutro Abs: 11.6 10*3/uL — ABNORMAL HIGH (ref 1.7–7.7)
Neutrophils Relative %: 78 %
Platelets: 619 10*3/uL — ABNORMAL HIGH (ref 150–400)
RBC: 5.08 MIL/uL (ref 4.22–5.81)
RDW: 14.6 % (ref 11.5–15.5)
WBC: 14.8 10*3/uL — ABNORMAL HIGH (ref 4.0–10.5)
nRBC: 0 % (ref 0.0–0.2)

## 2019-01-31 LAB — MAGNESIUM: Magnesium: 2.2 mg/dL (ref 1.7–2.4)

## 2019-01-31 LAB — TSH: TSH: 2.062 u[IU]/mL (ref 0.350–4.500)

## 2019-01-31 MED ORDER — OXYCODONE HCL 5 MG PO TABS: 5.0000 mg | ORAL_TABLET | ORAL | 0 refills | Status: AC | PRN

## 2019-01-31 NOTE — Progress Notes (Signed)
The patient c/o lower abdomen and back pain ( 8). The patient reports increase SOB with standing.

## 2019-01-31 NOTE — Telephone Encounter (Signed)
Contacted Hospice and spoke with nurse. Informed her Dr. Mike Gip would like patient to be seen today. Nurse states they will reach out to patient to see him today.

## 2019-01-31 NOTE — Telephone Encounter (Signed)
Lab called to say that urine is very orange with a high bilirubin and they are unable to do the urine dipstick for protein.  They are canceling the lab order.  MD made aware.

## 2019-02-03 ENCOUNTER — Ambulatory Visit: Payer: Commercial Managed Care - PPO | Admitting: Gastroenterology

## 2019-02-03 NOTE — Progress Notes (Deleted)
Primary Care Physician: Sallee Lange, NP  Primary Gastroenterologist:  Dr. Lucilla Lame  No chief complaint on file.   HPI: Darin Lowery is a 64 y.o. male here for follow-up after having EGD.  The patient has a history of cirrhosis with hepatocellular carcinoma.  The patient had an EGD to rule out varices due to the concern of bleeding while on chemotherapy.  The patient was found to have grade 1 esophageal varices and started on a beta-blocker as primary prophylaxis.  Current Outpatient Medications  Medication Sig Dispense Refill  . nadolol (CORGARD) 40 MG tablet Take 1 tablet (40 mg total) by mouth daily. 30 tablet 11  . ondansetron (ZOFRAN) 8 MG tablet Take 1 tablet (8 mg total) by mouth every 8 (eight) hours as needed for nausea or vomiting. (Patient not taking: Reported on 01/23/2019) 20 tablet 0  . oxyCODONE (OXY IR/ROXICODONE) 5 MG immediate release tablet Take 1 tablet (5 mg total) by mouth every 4 (four) hours as needed for severe pain. 45 tablet 0  . pantoprazole (PROTONIX) 40 MG tablet Take 1 tablet (40 mg total) by mouth daily. (Patient not taking: Reported on 01/30/2019) 30 tablet 6  . polyethylene glycol powder (GLYCOLAX/MIRALAX) 17 GM/SCOOP powder Take 1 Container by mouth once.     No current facility-administered medications for this visit.    Allergies as of 02/03/2019  . (No Known Allergies)    ROS:  General: Negative for anorexia, weight loss, fever, chills, fatigue, weakness. ENT: Negative for hoarseness, difficulty swallowing , nasal congestion. CV: Negative for chest pain, angina, palpitations, dyspnea on exertion, peripheral edema.  Respiratory: Negative for dyspnea at rest, dyspnea on exertion, cough, sputum, wheezing.  GI: See history of present illness. GU:  Negative for dysuria, hematuria, urinary incontinence, urinary frequency, nocturnal urination.  Endo: Negative for unusual weight change.    Physical Examination:   There were no vitals  taken for this visit.  General: Well-nourished, well-developed in no acute distress.  Eyes: No icterus. Conjunctivae pink. Lungs: Clear to auscultation bilaterally. Non-labored. Heart: Regular rate and rhythm, no murmurs rubs or gallops.  Abdomen: Bowel sounds are normal, nontender, nondistended, no hepatosplenomegaly or masses, no abdominal bruits or hernia , no rebound or guarding.   Extremities: No lower extremity edema. No clubbing or deformities. Neuro: Alert and oriented x 3.  Grossly intact. Skin: Warm and dry, no jaundice.   Psych: Alert and cooperative, normal mood and affect.  Labs:  ***  Imaging Studies: NM Bone Scan Whole Body  Result Date: 01/08/2019 CLINICAL DATA:  Liver mass.  Rib pain. EXAM: NUCLEAR MEDICINE WHOLE BODY BONE SCAN TECHNIQUE: Whole body anterior and posterior images were obtained approximately 3 hours after intravenous injection of radiopharmaceutical. RADIOPHARMACEUTICALS:  22.2 mCi Technetium-48m MDP IV COMPARISON:  CT 12/29/2018 FINDINGS: Within the posterior aspect of the LEFT iliac bone along the SI joint there is a discrete focus of radiotracer activity which is asymmetric and focal. No additional foci of abnormal radiotracer activity. Comparison CT scan there is small lucency at this location which is nonspecific (image 90/5). In particular no abnormal activity associated with the ribs. IMPRESSION: 1. Focal activity in the medial LEFT iliac bone. Depending on clinical concern this could be further evaluated with MRI of the pelvis. 2. Otherwise no evidence of malignancy. Electronically Signed   By: Suzy Bouchard M.D.   On: 01/08/2019 20:56   MR LIVER W WO CONTRAST  Result Date: 01/11/2019 CLINICAL DATA:  Weight loss of 30  pounds over the prior 2 months. Liver masses and suspected pulmonary metastases on recent CT. AF P 38,357. Biopsy planning. Additional history of sarcoidosis. EXAM: MRI ABDOMEN WITHOUT AND WITH CONTRAST TECHNIQUE: Multiplanar multisequence  MR imaging of the abdomen was performed both before and after the administration of intravenous contrast. CONTRAST:  34mL GADAVIST GADOBUTROL 1 MMOL/ML IV SOLN COMPARISON:  12/29/2018 CT chest, abdomen and pelvis. FINDINGS: Lower chest: Numerous subcentimeter pulmonary nodules scattered at both lung bases as seen on recent chest CT. Hepatobiliary: Diffusely finely irregular liver surface with relative hypertrophy of the lateral segment left liver lobe, compatible with hepatic cirrhosis. No hepatic steatosis. Dominant poorly marginated 12.3 x 11.4 cm superior liver mass predominantly involving segments 7, 8 and 4A (series 7/image 12) with heterogeneous T2 hyperintensity and heterogeneous avid enhancement. Numerous (greater than 10) additional avidly heterogeneously enhancing masses scattered throughout the liver. Representative 2.2 x 1.8 cm segment 2 left liver lobe mass (series 7/image 13) and 1.9 x 1.8 cm posterior right liver mass (series 7/image 14). Normal gallbladder with no cholelithiasis. Scattered regions of mild intrahepatic biliary ductal dilatation throughout the liver. Common bile duct diameter 2 mm. No choledocholithiasis. Pancreas: No pancreatic mass or duct dilation.  No pancreas divisum. Spleen: Splenectomy. Adrenals/Urinary Tract: Normal adrenals. No hydronephrosis. Scattered subcentimeter simple renal cysts in both kidneys. No suspicious renal masses. Stomach/Bowel: Normal non-distended stomach. Visualized small and large bowel is normal caliber, with no bowel wall thickening. Vascular/Lymphatic: Normal caliber abdominal aorta. Patent portal and renal veins. Narrowing of the intrahepatic portion of the IVC and of hepatic veins by the dominant liver mass. Moderate to large esophageal varices. Porta hepatis adenopathy up to 1.9 cm short axis diameter (series 13/image 41). Mildly enlarged 1.1 cm gastrohepatic ligament node (series 13/image 37). Mild right pericardiophrenic adenopathy up to 1.0 cm  (series 13/image 15). Other: Trace perihepatic ascites.  No focal fluid collections. Musculoskeletal: Nonspecific small enhancing T2 hyperintense L1 and L2 vertebral lesions without correlate on recent bone scan, favoring small vertebral hemangiomas. No overtly suspicious focal osseous lesions. IMPRESSION: 1. Cirrhosis. Avidly enhancing dominant poorly marginated 12.3 cm superior liver mass, compatible with locally advanced hepatocellular carcinoma. 2. Numerous (greater than 10) additional avidly enhancing liver metastases scattered throughout the liver as detailed. 3. Porta hepatis and gastrohepatic ligament lymphadenopathy, suspicious for metastatic disease. 4. Numerous subcentimeter pulmonary nodules scattered at both lung bases as seen on recent chest CT, suspicious for pulmonary metastases. 5. Trace perihepatic ascites.  Moderate to large esophageal varices. 6. Scattered regions of mild intrahepatic biliary ductal dilatation throughout the liver due to mass-effect from liver masses. Normal caliber common bile duct (2 mm diameter). Electronically Signed   By: Ilona Sorrel M.D.   On: 01/11/2019 16:47   US ABDOMEN LIMITED RUQ  Result Date: 01/23/2019 CLINICAL DATA:  HCC.  Elevated bilirubin. EXAM: ULTRASOUND ABDOMEN LIMITED RIGHT UPPER QUADRANT COMPARISON:  MRI 01/11/2019.  CT 12/29/2018. FINDINGS: Gallbladder: 9 mm polyp noted. Questionable sludge. Gallbladder wall thickness 1.7 mm. Common bile duct: Diameter: 3.4 mm. Liver: Multiple indistinct mass lesions noted throughout the liver. These appear to be confluent. Reference is made to prior study MRI of 01/11/2019. Portal vein is patent on color Doppler imaging with normal direction of blood flow towards the liver. Other: None. IMPRESSION: 1. 9 mm gallbladder polyp noted. Questionable gallbladder sludge. No biliary distention. 2. Confluent indistinct mass lesions noted throughout the liver. Reference is made to recent MRI report of 01/11/2019. Electronically  Signed   By: Marcello Moores  Register  On: 01/23/2019 16:19    Assessment and Plan:   Ronny Sico is a 64 y.o. y/o male ***     Lucilla Lame, MD. Marval Regal    Note: This dictation was prepared with Dragon dictation along with smaller phrase technology. Any transcriptional errors that result from this process are unintentional.

## 2019-02-04 ENCOUNTER — Other Ambulatory Visit: Payer: Self-pay | Admitting: Hospice and Palliative Medicine

## 2019-02-04 MED ORDER — MORPHINE SULFATE ER 15 MG PO TBCR: 15.0000 mg | EXTENDED_RELEASE_TABLET | Freq: Two times a day (BID) | ORAL | 0 refills | Status: DC

## 2019-02-04 MED ORDER — DOCUSATE SODIUM 100 MG PO CAPS: 100.0000 mg | ORAL_CAPSULE | Freq: Two times a day (BID) | ORAL | 2 refills | Status: AC | PRN

## 2019-02-04 NOTE — Progress Notes (Signed)
I called and spoke with patient's hospice nurse. Reportedly, patient has been taking oxycodone every 4 hours around the clock. He is waking at night due to pain.   Plan:  -MS Contin 15mg  Q12H.  -May continue oxycodone prn for BTP.  -May increase senna to BID. Add Colace PRN

## 2019-02-12 ENCOUNTER — Other Ambulatory Visit: Payer: Self-pay | Admitting: Hospice and Palliative Medicine

## 2019-02-12 ENCOUNTER — Telehealth: Payer: Self-pay

## 2019-02-12 MED ORDER — SODIUM CHLORIDE 0.9 % IV SOLN: INTRAVENOUS | 0 refills | Status: DC

## 2019-02-12 MED ORDER — SODIUM CHLORIDE 0.9 % IV SOLN: INTRAVENOUS | 0 refills | Status: AC

## 2019-02-12 NOTE — Progress Notes (Signed)
I spoke with patient's hospice nurse.  Patient is actively declining.  He is no longer able to take MS Contin.  Oral intake is minimal.  Family are giving him 10 mg oral morphine elixir every 2 hours around-the-clock.  Family is struggling to keep up with dosing overnight.  Hospice has requested a morphine infusion.  Total oral MME is approximately 120 mg / 24 hours  PO to IV dose equivalency is approximately 40 mg / 24 hours  We will plan to start morphine infusion at 0.5 mg/h continuous infusion with 0.5 mg bolus dose every 30 minutes as needed.   Spoke with hospice pharmacist.  Rx faxed to [facility].

## 2019-02-12 NOTE — Telephone Encounter (Signed)
Inbound call received from [place], [place] with [facility] to inform us that patient has been using Morphine around the clock at approx. q2 hours. She is asking if a pain pump can be called in for this patient. Informed Billey Chang, NP. He states he will reach out to her. ([place])

## 2019-03-03 DEATH — deceased

## 2020-08-07 IMAGING — CR DG CHEST 2V
1 series · 3 of 3 positions shown · non-contrast
Comparison: 10/07/2018

CLINICAL DATA: Chest pain

EXAM:
CHEST - 2 VIEW

[Series 1: dg chest 2 view · 0.14mm/px · 3 of 3 slices shown]
[im 1/3]
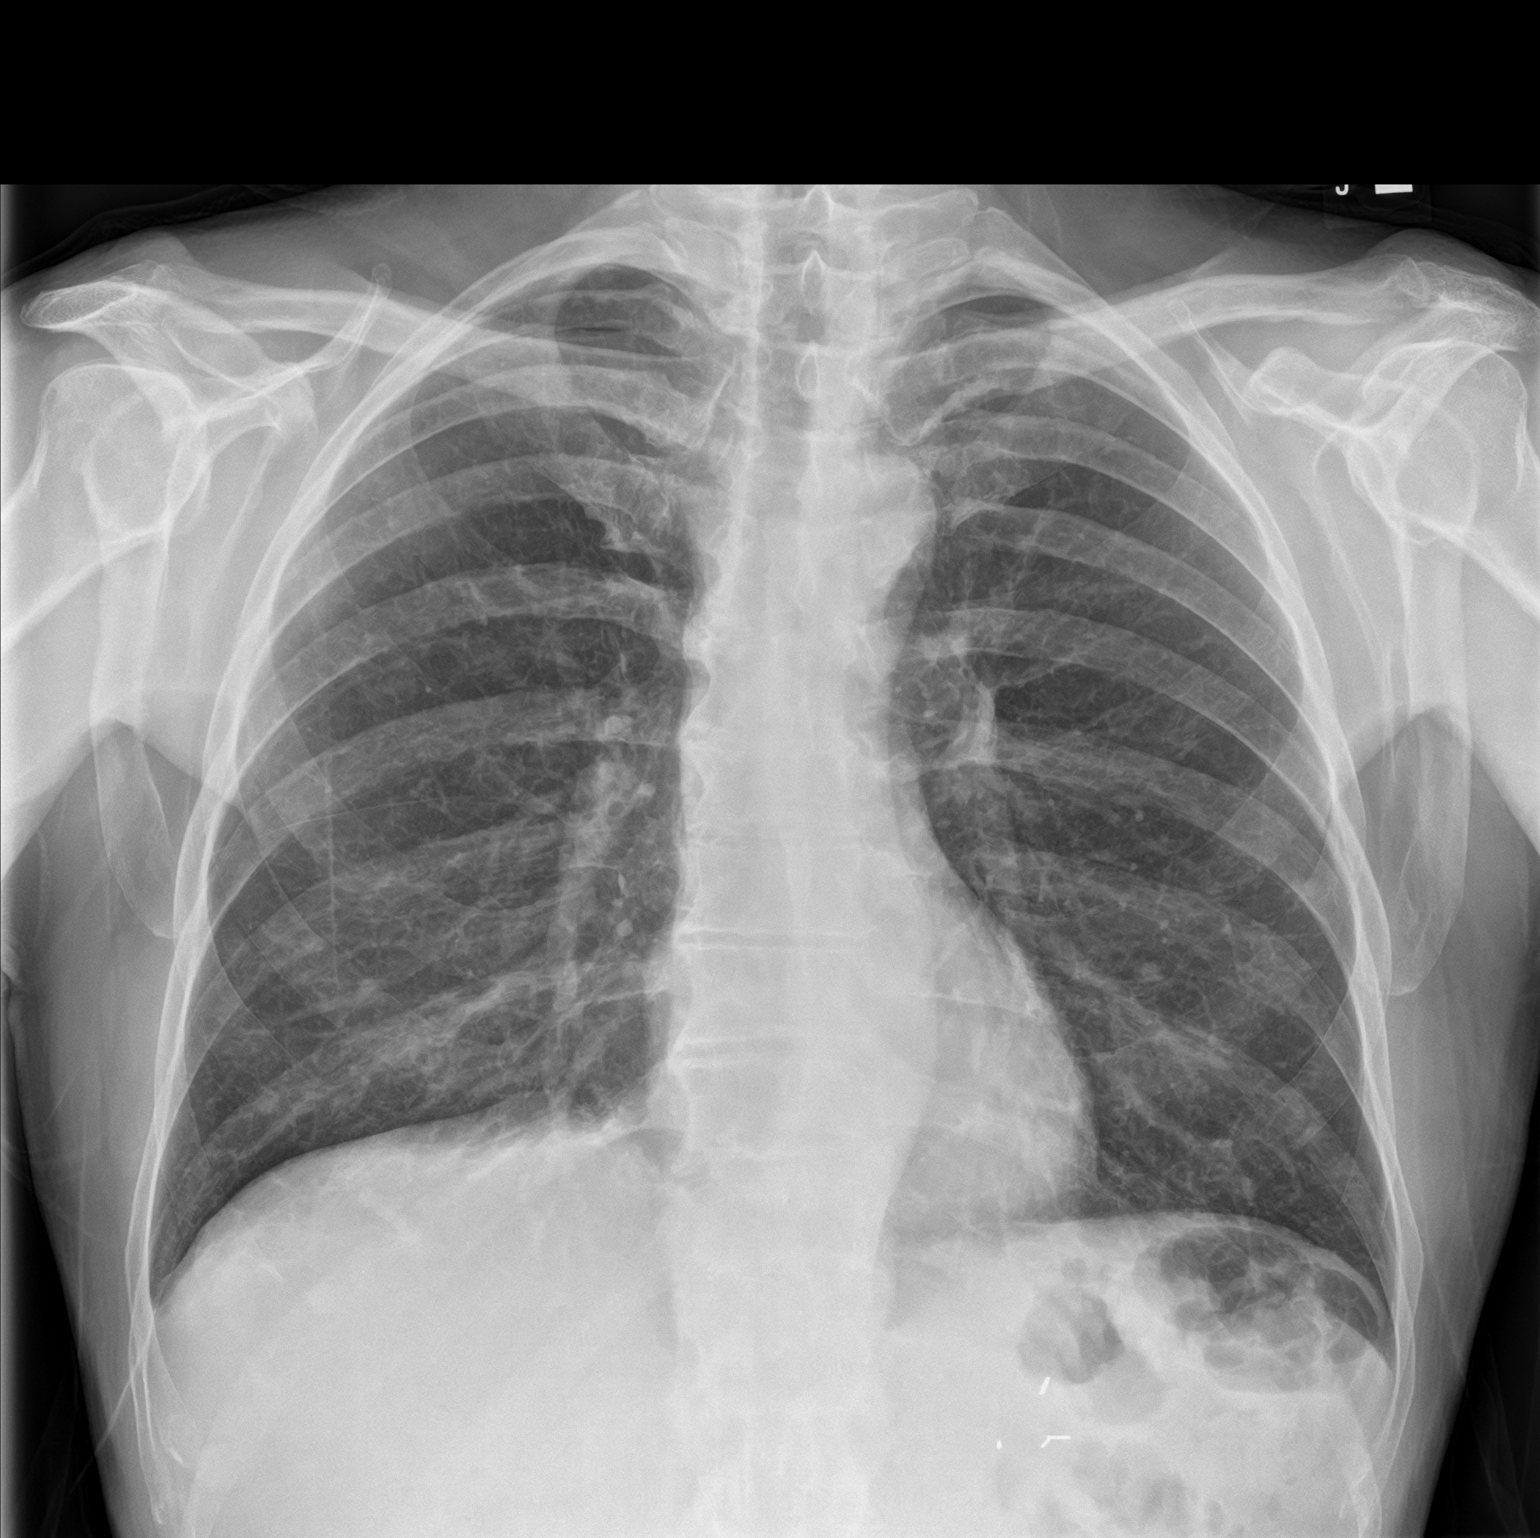
[im 2/3]
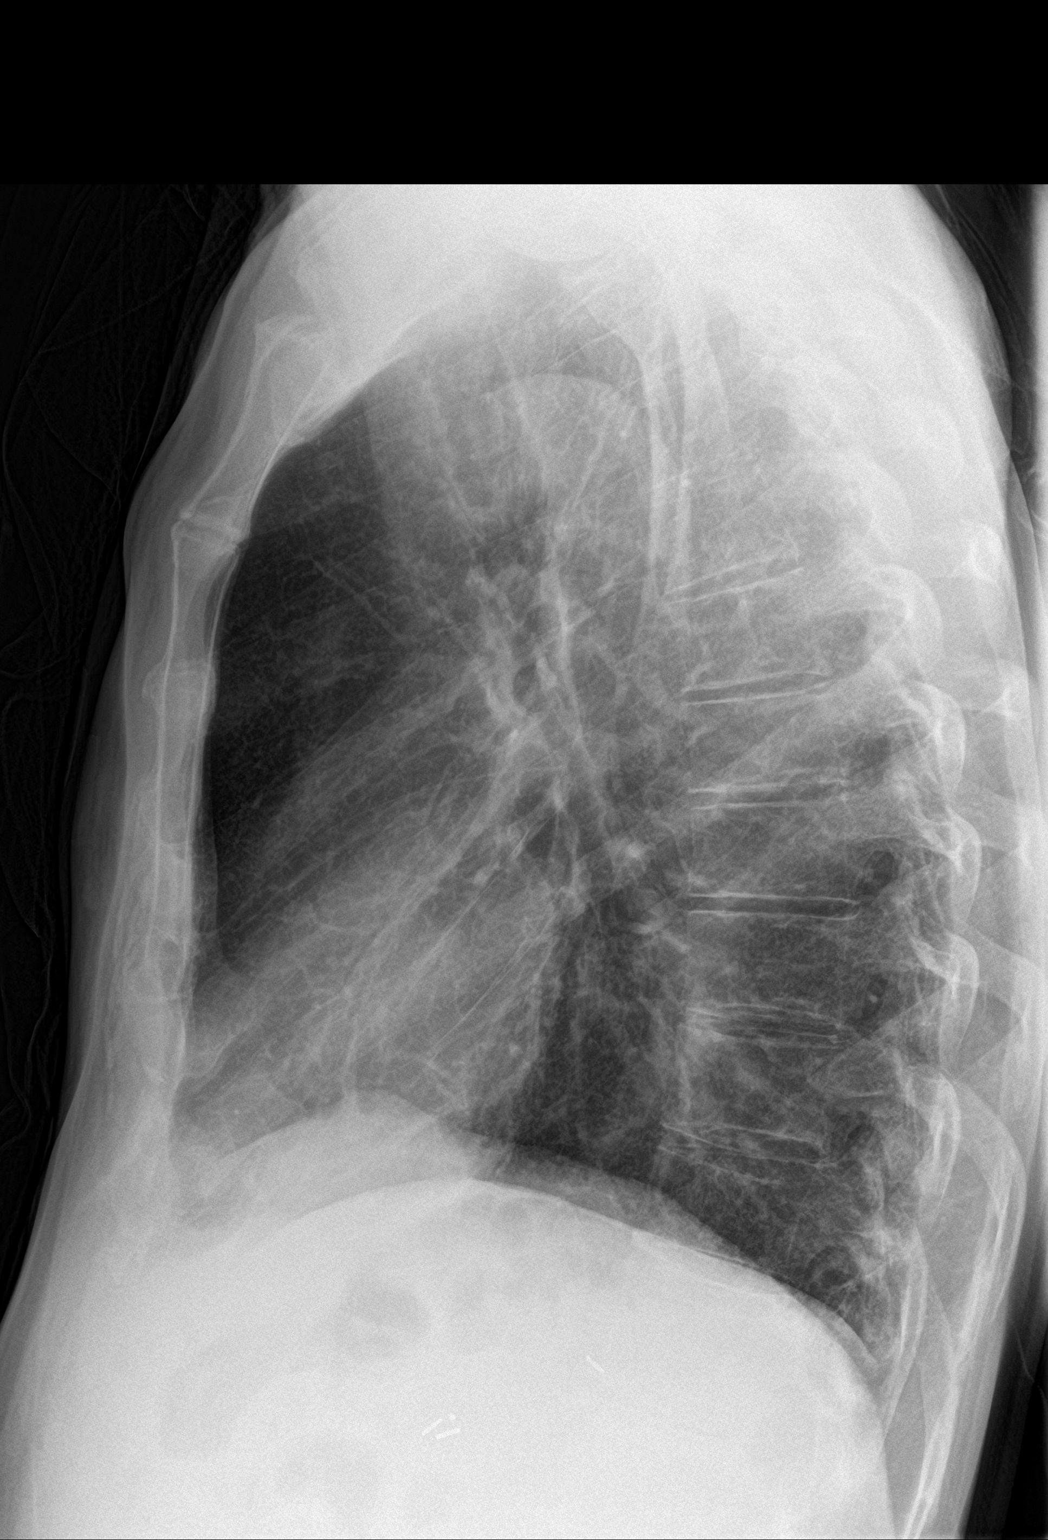
[im 3/3]
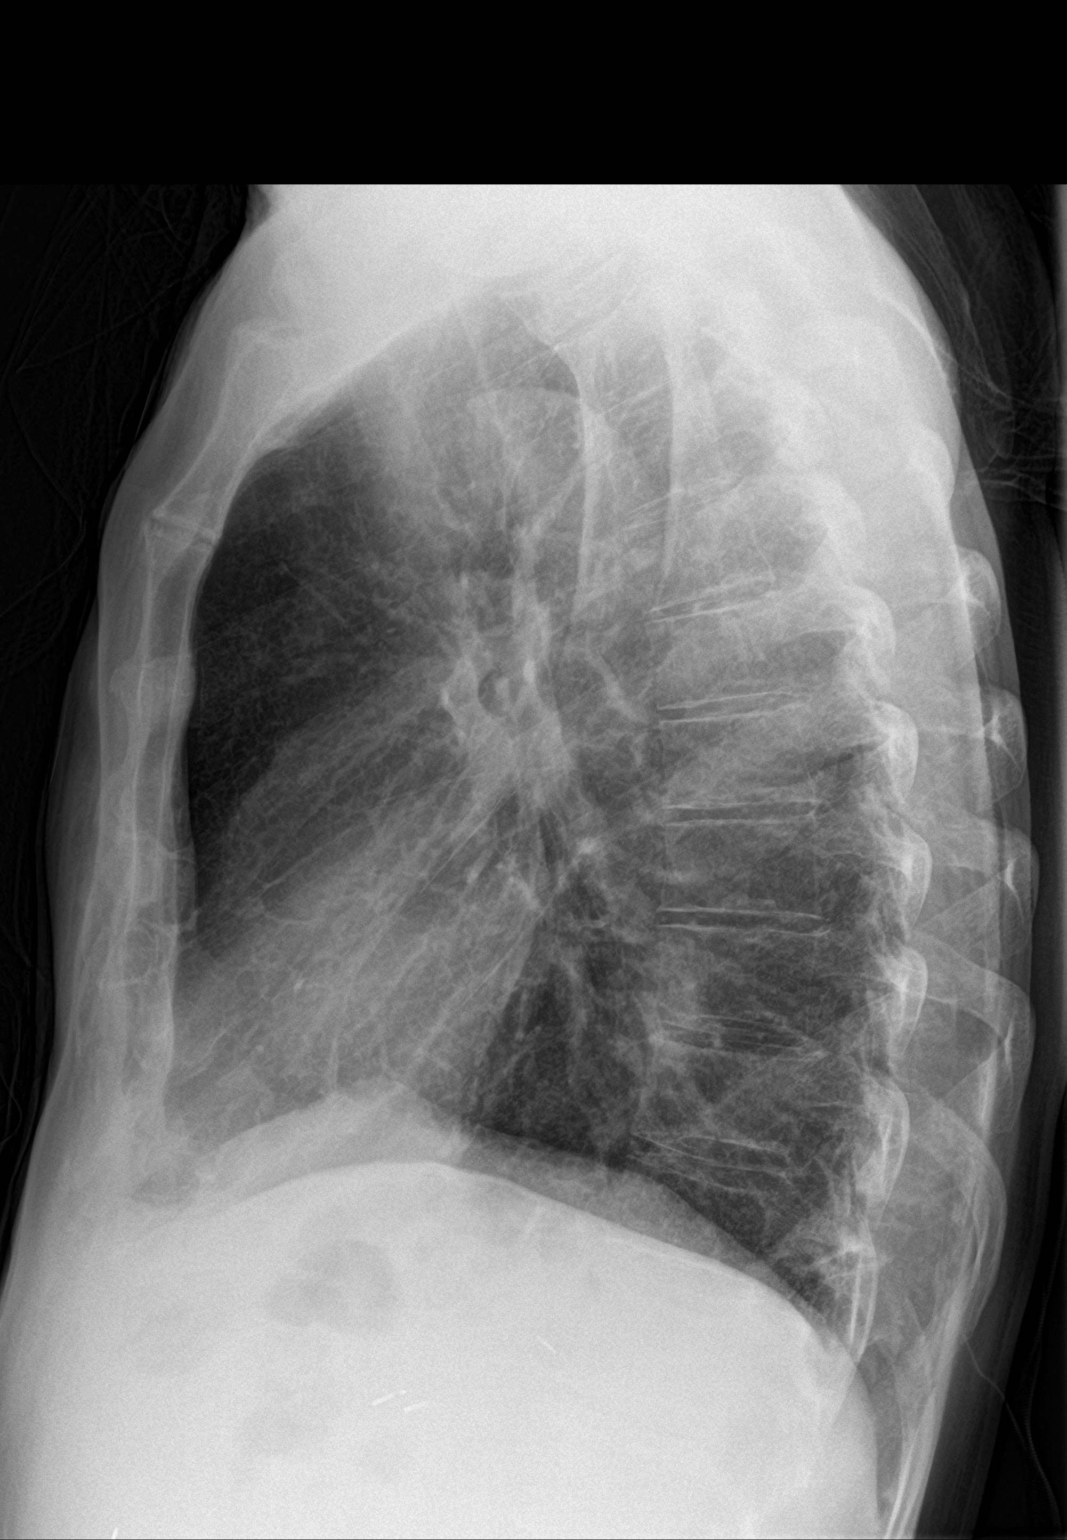

[3 of 3 positions shown; findings below may reference images not displayed]

FINDINGS: Normal heart size, mediastinal contours, and pulmonary vascularity.

Bronchitic changes without pulmonary infiltrate, pleural effusion or
pneumothorax.

Question RIGHT nipple shadow.

Several questionable nodular foci are seen in the lower LEFT lung up
to 8 mm diameter.

Minor degenerative disc disease changes thoracic spine.
IMPRESSION: Bronchitic changes with questionable LEFT lung nodular foci and
potential RIGHT nipple shadow; CT chest recommended to exclude
pulmonary nodules.

## 2020-08-07 IMAGING — CT CT CHEST W/ CM
2 of 4 series · 11 of 36 positions shown, 13 images · IV contrast (omnipaque)
Comparison: None.

CLINICAL DATA: 63-year-old presenting with an approximate 30 pound
weight loss over the past 2 months associated with generalized
weakness, fatigue, LEFT-sided chest pain and LEFT UPPER QUADRANT
abdominal pain. Surgical history includes splenectomy, appendectomy
and unspecified kidney surgery.

EXAM:
CT CHEST, ABDOMEN, AND PELVIS WITH CONTRAST
TECHNIQUE: Multidetector CT imaging of the chest, abdomen and pelvis was
performed following the standard protocol during bolus
administration of intravenous contrast.
CONTRAST:  100mL OMNIPAQUE IOHEXOL 300 MG/ML IV.

[Series 2: cap with · axial · 0.71mm/px · z∈[-627,-67]mm · 8 of 136 slices shown, 10 images]
[im 12/136  mediastinal]
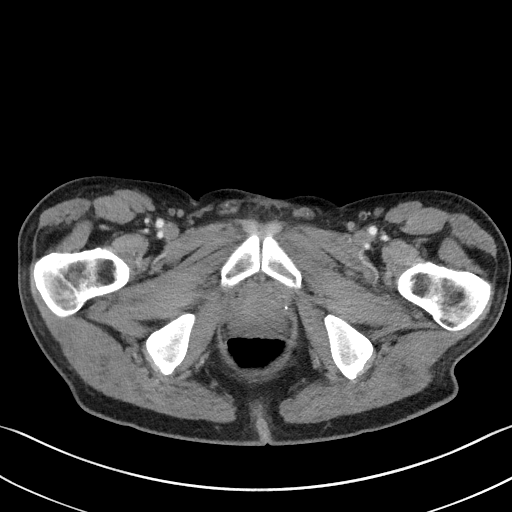
[im 12/136  lung]
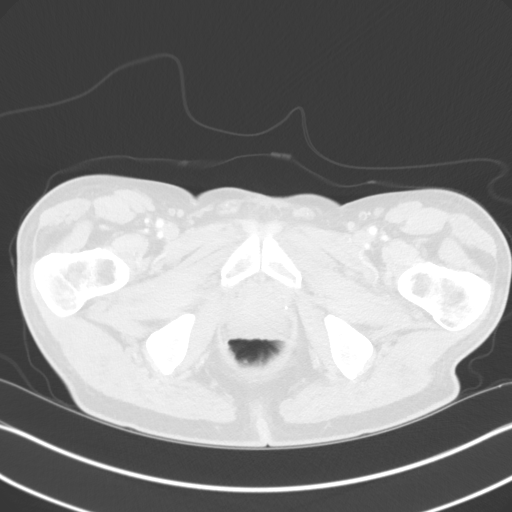
[im 34/136  lung]
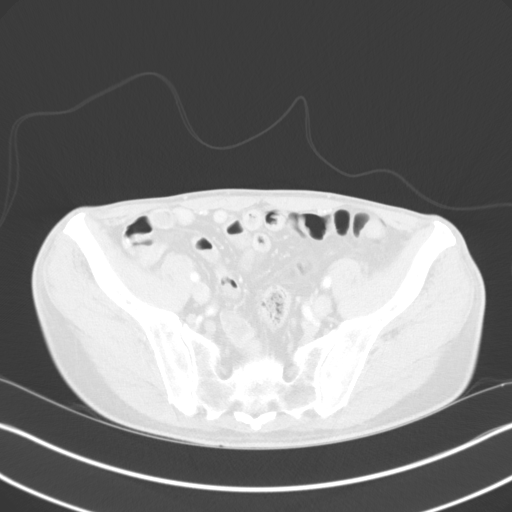
[im 46/136  lung]
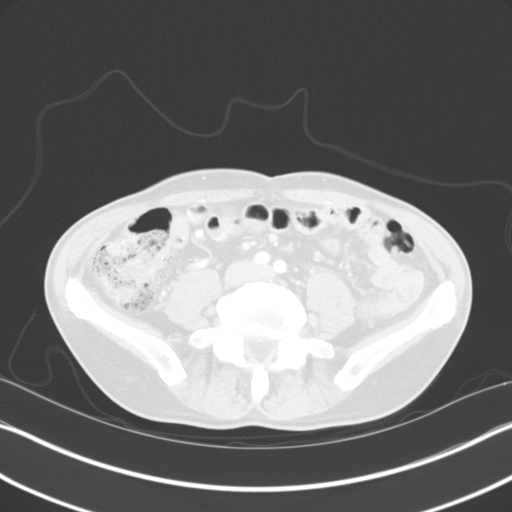
[im 57/136  lung]
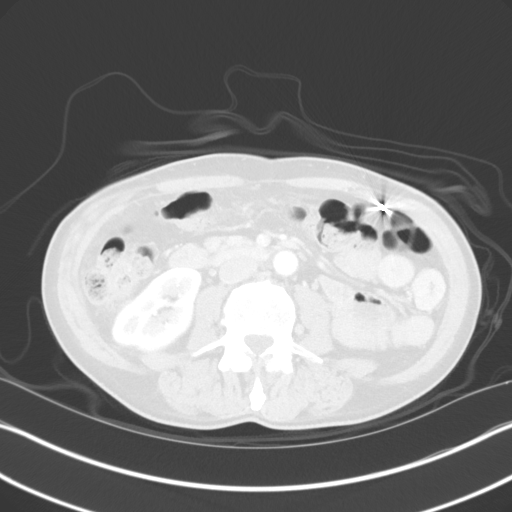
[im 79/136  mediastinal]
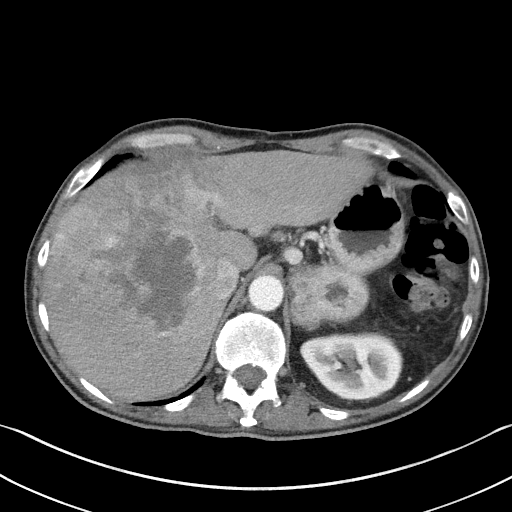
[im 79/136  lung]
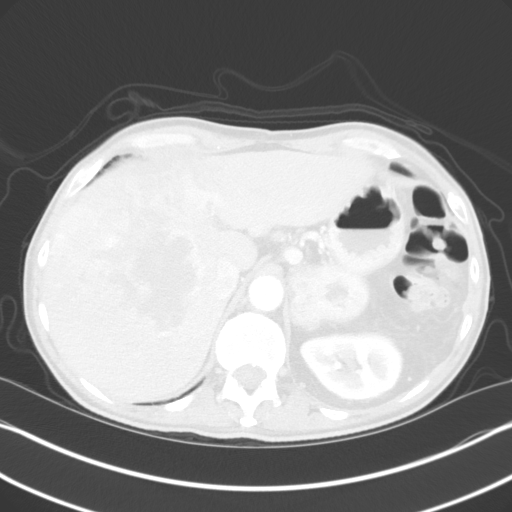
[im 91/136  lung]
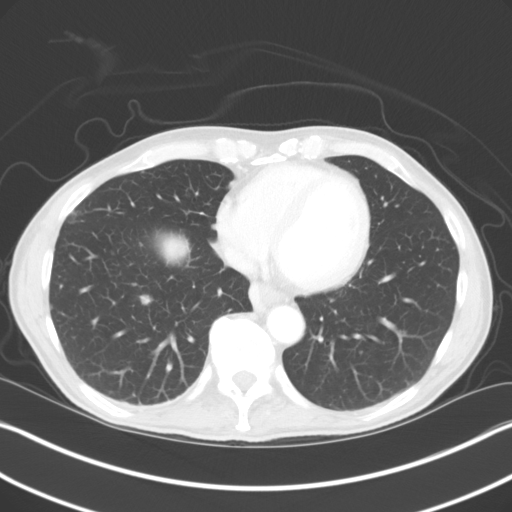
[im 102/136  lung]
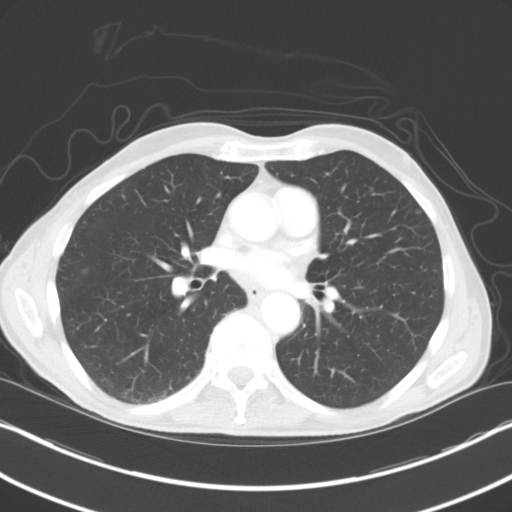
[im 124/136  lung]
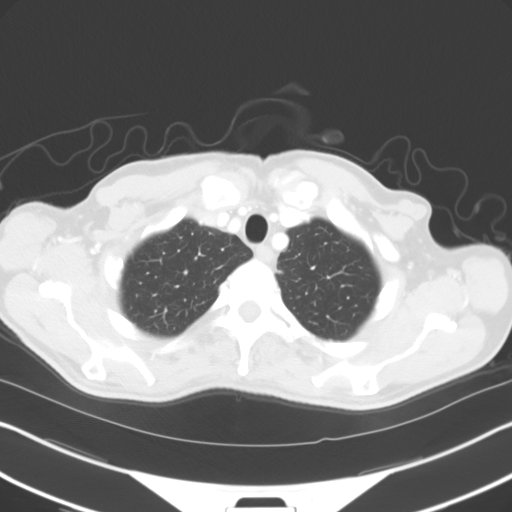

[Series 5: coronals · coronal · 0.71mm/px · 3 of 117 slices shown]
[im 24/117  lung]
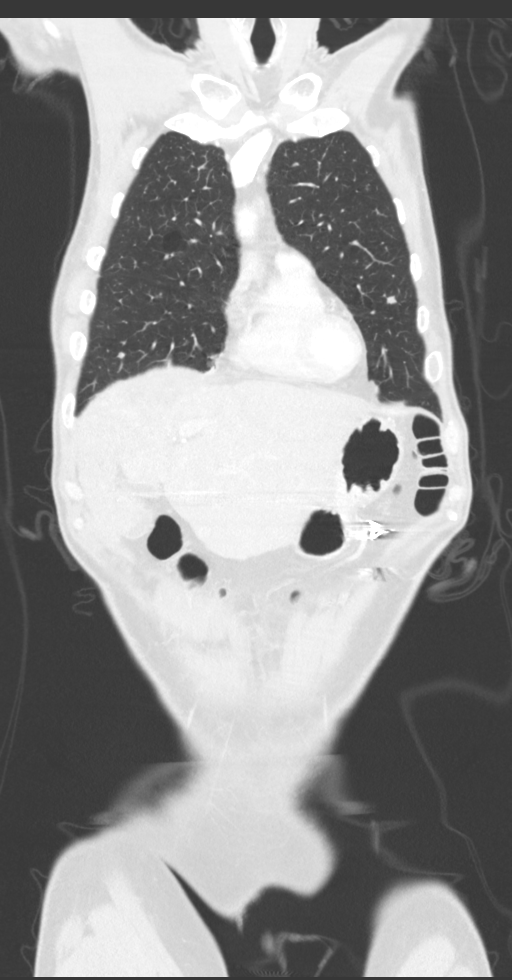
[im 47/117  lung]
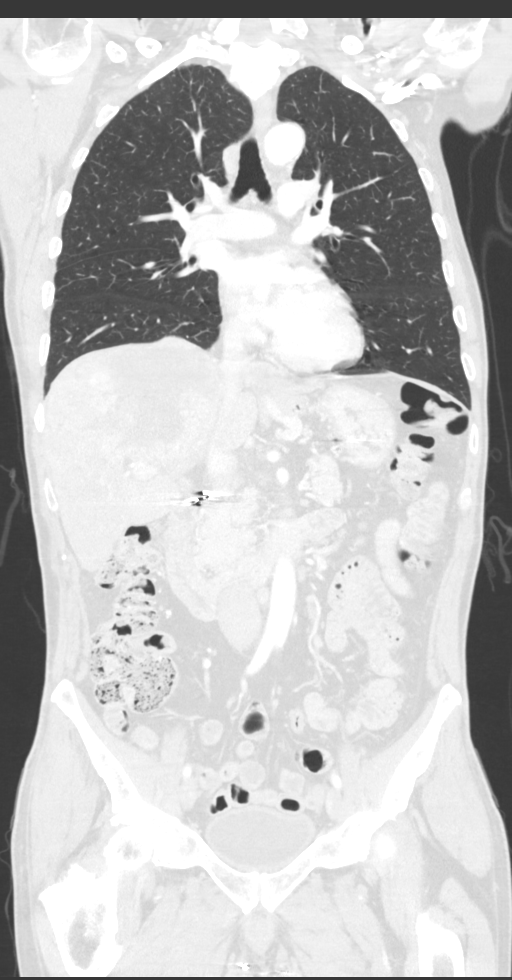
[im 70/117  lung]
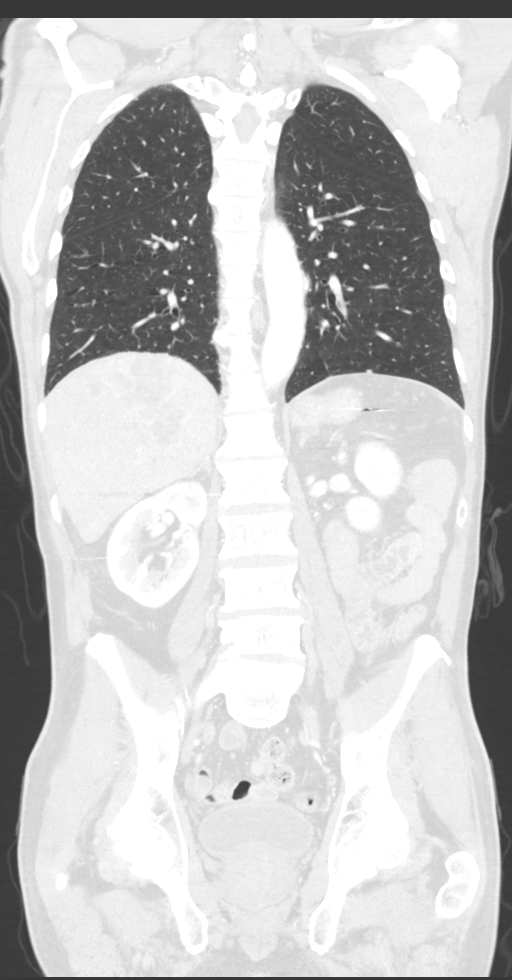

[11 of 36 positions shown; findings below may reference images not displayed]

FINDINGS: CT CHEST FINDINGS

Cardiovascular: Normal heart size. No visible coronary
atherosclerosis. No pericardial effusion.

Mild atherosclerosis involving the thoracic aorta. Proximal
descending thoracic aortic aneurysm measuring approximately 3.1 cm
diameter. No evidence of ascending thoracic aortic aneurysm.
Visualized great vessels widely patent with mild atherosclerosis.

Mediastinum/Nodes: Adjacent mildly enlarged enhancing subcarinal
(station 7) lymph nodes which are partially necrotic, the larger
measuring approximately 1.4 x 2.3 cm. Necrotic paracardiac lymph
node INFERIOR to the heart measuring approximately 1.7 x 2.3 cm.
Enlarged RIGHT paracardiac lymph nodes.

Varices involving the distal esophagus. Esophagus otherwise normal
in appearance. Approximate 1.1 cm nodule arising from the RIGHT lobe
of the thyroid gland.

Lungs/Pleura: Mild emphysematous changes in both lungs. Numerous
BILATERAL lung nodules in a predominantly peripheral distribution,
the largest measuring approximately 12 x 8 mm (mean 10 mm) in the
posterosuperior RIGHT LOWER LOBE (series 4, image 73). No confluent
or ground-glass airspace consolidation. No evidence of interstitial
lung disease. No pleural effusions. Central airways patent without
significant bronchial wall thickening.

Musculoskeletal: Regional skeleton unremarkable without acute or
significant osseous abnormality. No evidence of osseous metastatic
disease.

CT ABDOMEN PELVIS FINDINGS

Hepatobiliary: Very large heterogeneously enhancing mass involving
the RIGHT lobe of the liver with approximate measurements of 12 x 12
x 10 cm. Satellite masses in the POSTERIOR segment RIGHT lobe
measuring approximately 1.5 x 1.3 x 1.8 cm and in the LATERAL
segment LEFT lobe measuring approximately 1.9 x 1.7 x 1.5 cm. Likely
hepatic cirrhosis as there is a mildly irregular hepatic contour and
there is relative enlargement of the LEFT lobe and caudate lobe.

Gallbladder normal in appearance without calcified gallstones. No
biliary ductal dilation.

Pancreas: Normal in appearance without evidence of mass, ductal
dilation, or inflammation.

Spleen: Surgically absent. No visible residual splenic tissue.

Adrenals/Urinary Tract: Normal appearing adrenal glands. Benign
cortical cysts in both kidneys. No solid renal masses. No
hydronephrosis. No urinary tract calculi. Normal appearing urinary
bladder.

Stomach/Bowel: Gastric varices. Stomach otherwise normal in
appearance for degree of distension. Normal-appearing small bowel.
Moderate stool burden in the cecum and ascending colon. Remainder of
the colon relatively decompressed. No focal abnormalities involving
the colon. Surgically absent appendix.

Vascular/Lymphatic: Mild aortoiliofemoral atherosclerosis without
evidence of aneurysm. Patent portal vein. Splenic vein surgically
absent. Patent SUPERIOR mesenteric vein. Visceral arteries patent.

Lymphadenopathy in the porta hepatis and in the gastrohepatic
ligament. No pathologic lymphadenopathy elsewhere in the abdomen or
pelvis.

Reproductive: Moderate prostate gland enlargement, particularly the
median lobe. Normal seminal vesicles.

Other: No ascites.

Musculoskeletal: Transitional L5 segment. Mild degenerative disc
disease at L3-4 with associated spondylosis. No acute findings. No
evidence of osseous metastatic disease.
IMPRESSION: 1. Very large heterogeneously enhancing mass involving the RIGHT
lobe of the liver, measured above, likely indicating hepatocellular
carcinoma.
2. Satellite masses in the POSTERIOR segment RIGHT lobe and in the
LATERAL segment LEFT lobe of the liver, measured above.
3. Metastatic lymphadenopathy in the porta hepatis and in the
gastrohepatic ligament.
4. Numerous BILATERAL pulmonary metastases.
5. Metastatic mediastinal lymphadenopathy.
6. Likely hepatic cirrhosis. No evidence of ascites.
7. Gastroesophageal varices.
8. Moderate prostate gland enlargement, particularly the median
lobe.
9. Proximal descending thoracic aortic aneurysm measuring 3.1 cm
diameter.

Aortic Atherosclerosis (82HCC-I81.1) and Emphysema (82HCC-VXZ.8).

## 2020-09-01 IMAGING — US US ABDOMEN LIMITED
1 series · 14 of 25 positions shown · non-contrast
Comparison: MRI 01/11/2019.  CT 12/29/2018.

CLINICAL DATA: HCC.  Elevated bilirubin.

EXAM:
ULTRASOUND ABDOMEN LIMITED RIGHT UPPER QUADRANT

[Series 1: us abdomen limited · 0.17mm/px · 14 of 68 slices shown]
[im 1/68]
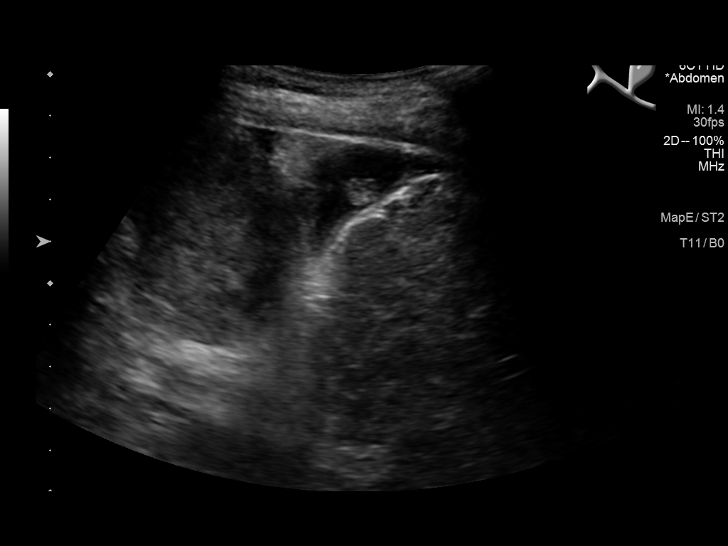
[im 6/68]
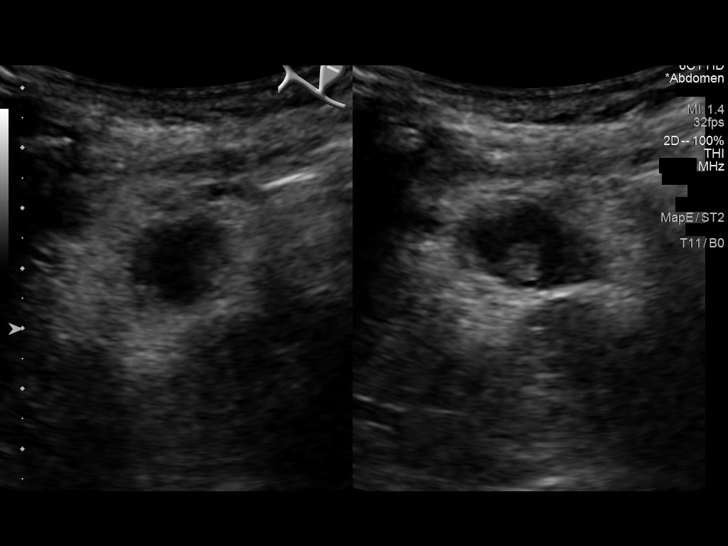
[im 12/68]
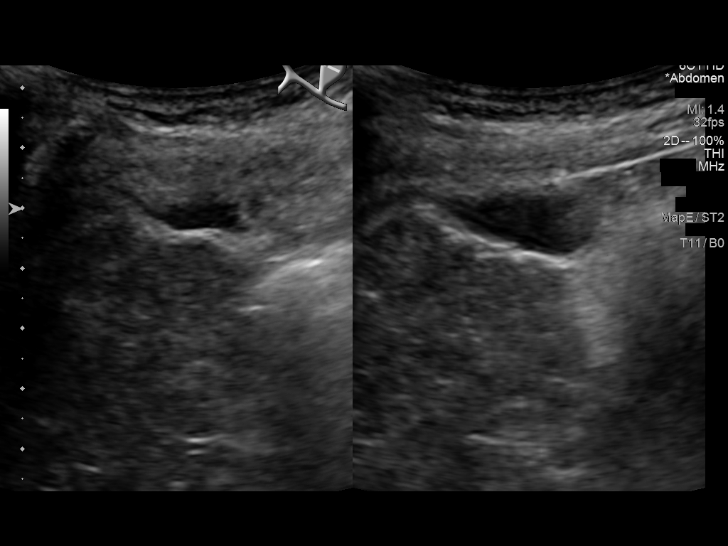
[im 17/68]
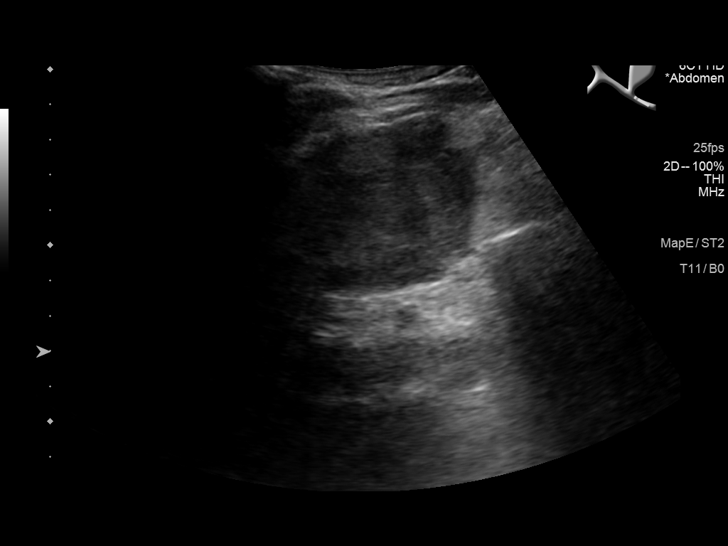
[im 23/68]
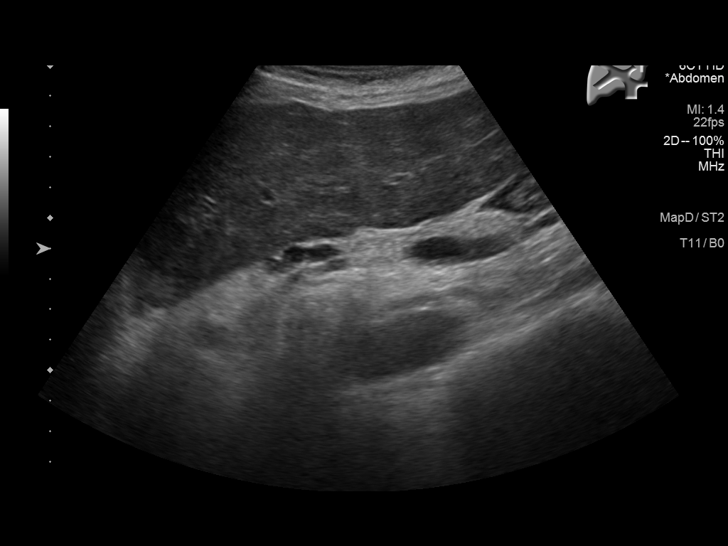
[im 26/68]
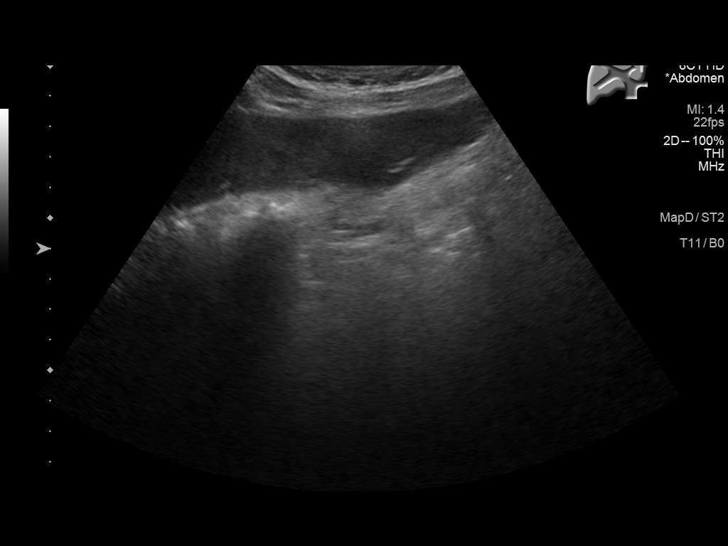
[im 31/68]
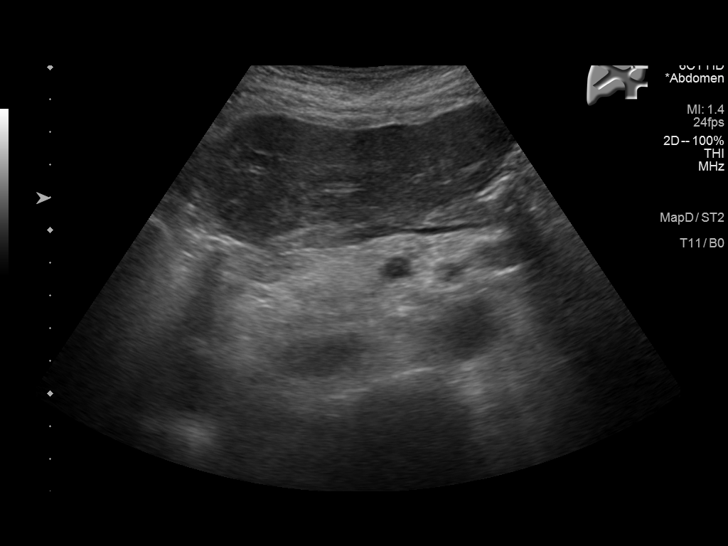
[im 37/68]
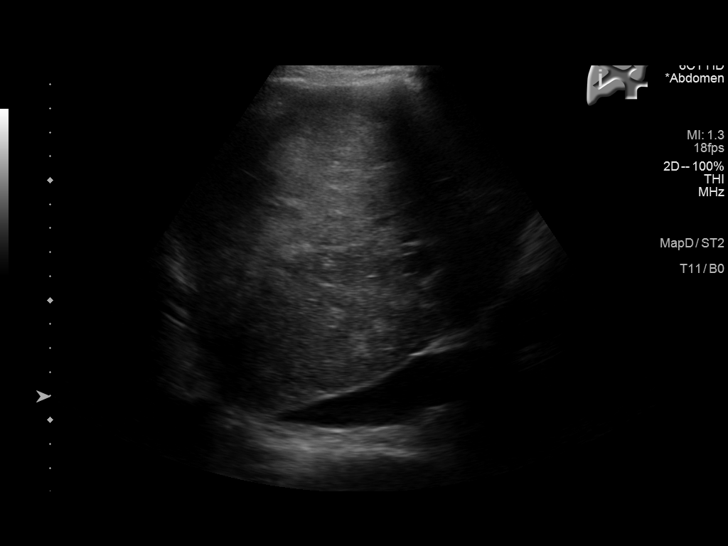
[im 42/68]
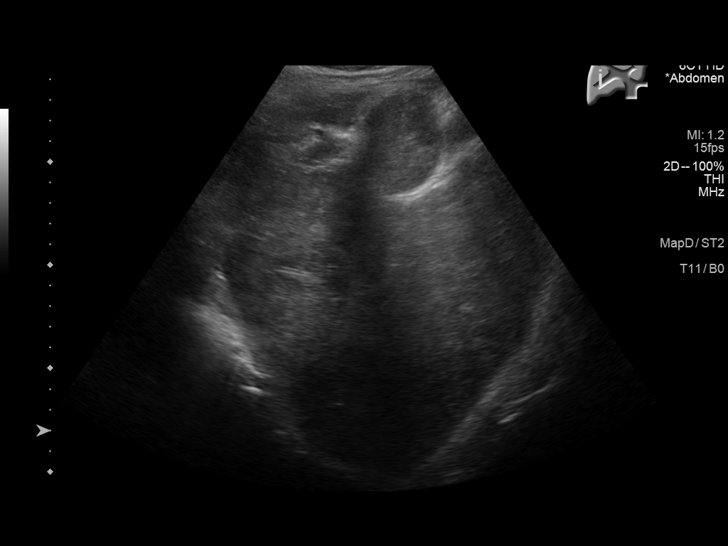
[im 45/68]
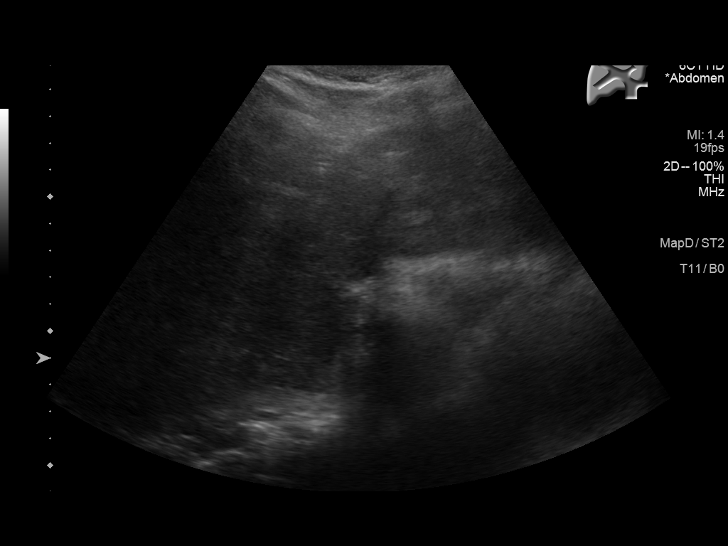
[im 51/68]
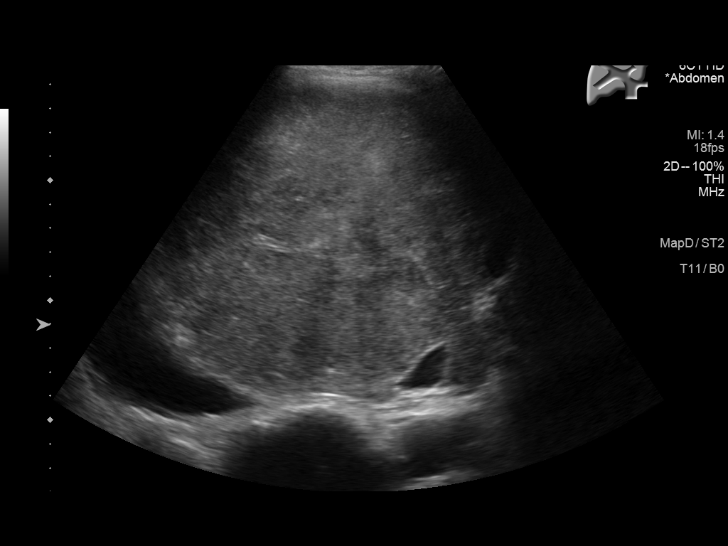
[im 56/68]
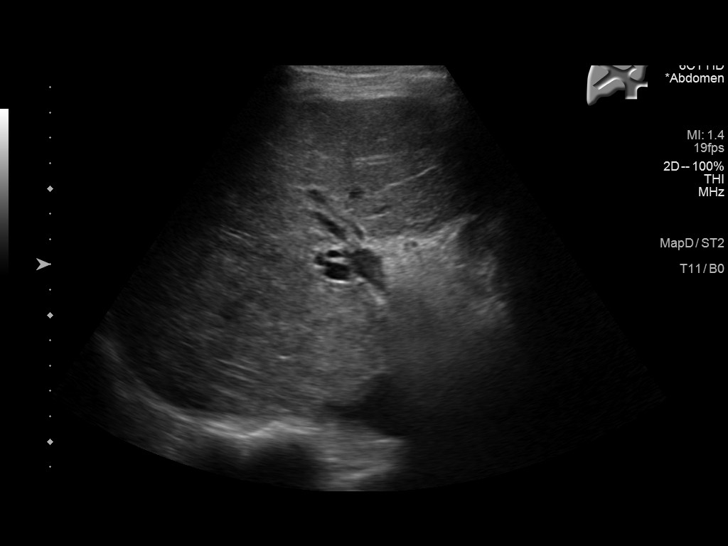
[im 62/68]
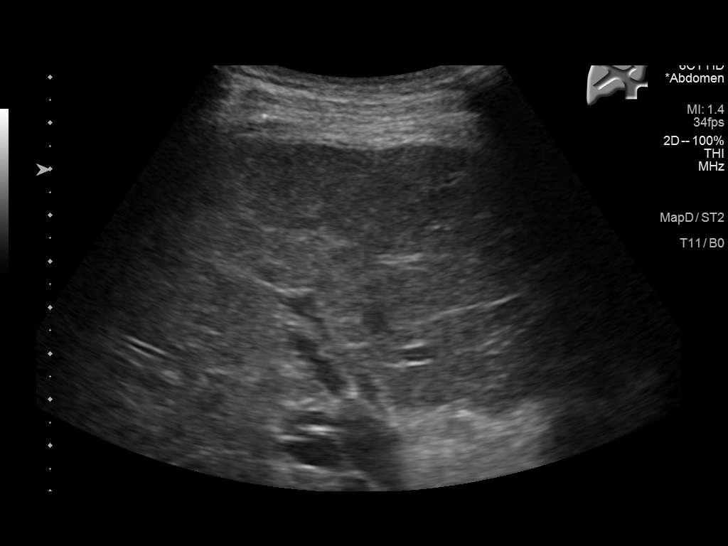
[im 68/68]
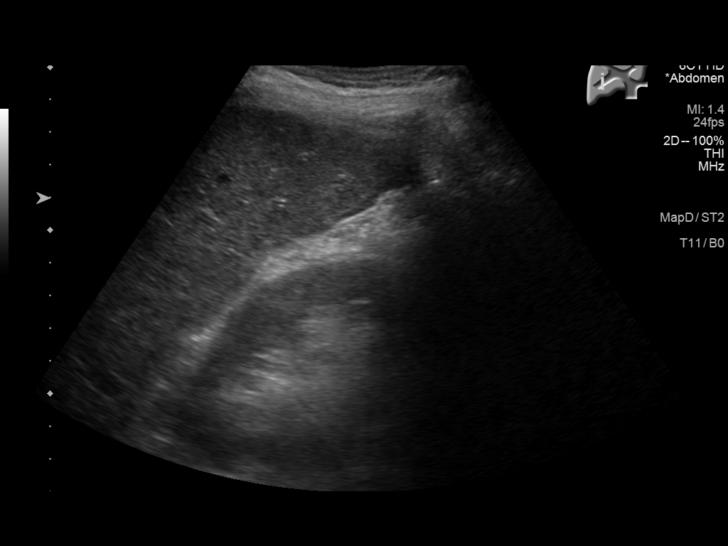

[14 of 25 positions shown; findings below may reference images not displayed]

FINDINGS: Gallbladder:

9 mm polyp noted. Questionable sludge. Gallbladder wall thickness
1.7 mm.

Common bile duct:

Diameter: 3.4 mm.

Liver:

Multiple indistinct mass lesions noted throughout the liver. These
appear to be confluent. Reference is made to prior study MRI of
01/11/2019. Portal vein is patent on color Doppler imaging with
normal direction of blood flow towards the liver.

Other: None.
IMPRESSION: 1. 9 mm gallbladder polyp noted. Questionable gallbladder sludge. No
biliary distention.

2. Confluent indistinct mass lesions noted throughout the liver.
Reference is made to recent MRI report of 01/11/2019.
